# Patient Record
Sex: Female | Born: 1960
Health system: Southern US, Community
[De-identification: ages and names within clinical notes are randomized; demographics above are authoritative.]

## PROBLEM LIST (undated history)

## (undated) DIAGNOSIS — Z8719 Personal history of other diseases of the digestive system: Secondary | ICD-10-CM

## (undated) DIAGNOSIS — F329 Major depressive disorder, single episode, unspecified: Secondary | ICD-10-CM

## (undated) DIAGNOSIS — R112 Nausea with vomiting, unspecified: Secondary | ICD-10-CM

## (undated) DIAGNOSIS — F32A Depression, unspecified: Secondary | ICD-10-CM

## (undated) DIAGNOSIS — Z9889 Other specified postprocedural states: Secondary | ICD-10-CM

## (undated) DIAGNOSIS — E119 Type 2 diabetes mellitus without complications: Secondary | ICD-10-CM

## (undated) DIAGNOSIS — R519 Headache, unspecified: Secondary | ICD-10-CM

## (undated) DIAGNOSIS — M1990 Unspecified osteoarthritis, unspecified site: Secondary | ICD-10-CM

## (undated) DIAGNOSIS — I10 Essential (primary) hypertension: Secondary | ICD-10-CM

## (undated) DIAGNOSIS — R011 Cardiac murmur, unspecified: Secondary | ICD-10-CM

## (undated) DIAGNOSIS — C801 Malignant (primary) neoplasm, unspecified: Secondary | ICD-10-CM

## (undated) DIAGNOSIS — R197 Diarrhea, unspecified: Secondary | ICD-10-CM

## (undated) DIAGNOSIS — Z9221 Personal history of antineoplastic chemotherapy: Secondary | ICD-10-CM

## (undated) DIAGNOSIS — Z87442 Personal history of urinary calculi: Secondary | ICD-10-CM

## (undated) DIAGNOSIS — F419 Anxiety disorder, unspecified: Secondary | ICD-10-CM

## (undated) DIAGNOSIS — D649 Anemia, unspecified: Secondary | ICD-10-CM

## (undated) DIAGNOSIS — R51 Headache: Secondary | ICD-10-CM

## (undated) HISTORY — PX: COLONOSCOPY: SHX174

## (undated) HISTORY — PX: CYSTOSCOPY: SUR368

## (undated) HISTORY — PX: ABDOMINAL HYSTERECTOMY: SHX81

## (undated) HISTORY — PX: TONSILLECTOMY: SUR1361

## (undated) HISTORY — PX: MASTECTOMY: SHX3

## (undated) HISTORY — PX: ESOPHAGUS SURGERY: SHX626

## (undated) HISTORY — PX: KNEE ARTHROSCOPY: SUR90

## (undated) HISTORY — PX: BREAST SURGERY: SHX581

## (undated) HISTORY — PX: CHOLECYSTECTOMY: SHX55

---

## 2002-09-19 ENCOUNTER — Emergency Department (HOSPITAL_COMMUNITY): Admission: EM | Admit: 2002-09-19 | Discharge: 2002-09-19 | Payer: Self-pay

## 2003-08-24 ENCOUNTER — Observation Stay (HOSPITAL_COMMUNITY): Admission: EM | Admit: 2003-08-24 | Discharge: 2003-08-25 | Payer: Self-pay | Admitting: Emergency Medicine

## 2003-09-07 ENCOUNTER — Emergency Department (HOSPITAL_COMMUNITY): Admission: EM | Admit: 2003-09-07 | Discharge: 2003-09-07 | Payer: Self-pay | Admitting: Emergency Medicine

## 2009-03-26 ENCOUNTER — Encounter: Payer: Self-pay | Admitting: Gastroenterology

## 2009-03-29 ENCOUNTER — Encounter (INDEPENDENT_AMBULATORY_CARE_PROVIDER_SITE_OTHER): Payer: Self-pay | Admitting: *Deleted

## 2009-04-21 DIAGNOSIS — K219 Gastro-esophageal reflux disease without esophagitis: Secondary | ICD-10-CM

## 2009-04-28 ENCOUNTER — Ambulatory Visit: Payer: Self-pay | Admitting: Gastroenterology

## 2009-04-28 DIAGNOSIS — R079 Chest pain, unspecified: Secondary | ICD-10-CM

## 2009-05-06 ENCOUNTER — Ambulatory Visit: Payer: Self-pay | Admitting: Gastroenterology

## 2009-05-07 ENCOUNTER — Telehealth: Payer: Self-pay | Admitting: Gastroenterology

## 2009-05-07 ENCOUNTER — Encounter: Payer: Self-pay | Admitting: Gastroenterology

## 2009-05-11 ENCOUNTER — Encounter: Payer: Self-pay | Admitting: Gastroenterology

## 2009-06-28 ENCOUNTER — Ambulatory Visit: Payer: Self-pay | Admitting: Gastroenterology

## 2009-06-28 DIAGNOSIS — R1013 Epigastric pain: Secondary | ICD-10-CM

## 2009-06-28 DIAGNOSIS — K2270 Barrett's esophagus without dysplasia: Secondary | ICD-10-CM

## 2009-06-28 DIAGNOSIS — K222 Esophageal obstruction: Secondary | ICD-10-CM | POA: Insufficient documentation

## 2009-09-06 ENCOUNTER — Telehealth: Payer: Self-pay | Admitting: Gastroenterology

## 2009-09-09 ENCOUNTER — Ambulatory Visit (HOSPITAL_COMMUNITY): Admission: RE | Admit: 2009-09-09 | Discharge: 2009-09-09 | Payer: Self-pay | Admitting: Gastroenterology

## 2009-09-09 ENCOUNTER — Encounter: Payer: Self-pay | Admitting: Gastroenterology

## 2009-09-14 ENCOUNTER — Encounter (INDEPENDENT_AMBULATORY_CARE_PROVIDER_SITE_OTHER): Payer: Self-pay | Admitting: *Deleted

## 2009-09-14 ENCOUNTER — Ambulatory Visit: Payer: Self-pay | Admitting: Gastroenterology

## 2009-09-15 ENCOUNTER — Telehealth: Payer: Self-pay | Admitting: Gastroenterology

## 2009-10-12 ENCOUNTER — Telehealth: Payer: Self-pay | Admitting: Gastroenterology

## 2010-04-18 ENCOUNTER — Encounter: Payer: Self-pay | Admitting: Gastroenterology

## 2010-05-09 ENCOUNTER — Encounter: Payer: Self-pay | Admitting: Gastroenterology

## 2010-05-31 NOTE — Progress Notes (Signed)
Summary: Update  ---- Converted from flag ---- ---- 10/11/2009 3:18 PM, Louis Meckel MD wrote: Ask pt if she has had a screening colon.  If not would schedule ------------------------------  Phone Note Outgoing Call   Call placed by: Laureen Ochs LPN,  October 12, 2009 8:41 AM Call placed to: Patient Summary of Call: Pt. had a Colonoscopy when she was in her mid 40's, showed some diverticulosis. Pt. will have that report sent to [place].  Pt. continues with some  soreness in RUQ, states the Clinoril has decreased the pain.  When does she need to see you again? Initial call taken by: Laureen Ochs LPN,  October 12, 2009 8:44 AM  Follow-up for Phone Call        f/u as needed if she's responding to clinoril f/u colo 3-4 years Follow-up by: Louis Meckel MD,  October 12, 2009 9:37 AM  Additional Follow-up for Phone Call Additional follow up Details #1::        Recall is in [facility] for 05/2013. Pt. will keep her scheduled f/u OV on 10-27-09. Pt. instructed to call back as needed.  Additional Follow-up by: Laureen Ochs LPN,  October 12, 2009 9:57 AM

## 2010-05-31 NOTE — Letter (Signed)
Summary: Patient Notice-Barrett's [facility]  [address] [place], [place] [address]   Phone: [phone]  Fax: [phone]        May 11, 2009 MRN: [id number]    [facility] [address] [place], [place]  [address]    Dear Ms. Witucki,  I am pleased to inform you that the biopsies taken during your recent endoscopic examination did not show any evidence of cancer upon pathologic examination.  However, your biopsies indicate you have a condition known as Barrett's esophagus. While not cancer, it is pre-cancerous (can progress to cancer) and needs to be monitored with repeat endoscopic examination and biopsies.  Fortunately, it is quite rare that this develops into cancer, but careful monitoring of the condition along with taking your medication as prescribed is important in reducing the risk of developing cancer.  It is my recommendation that you have a repeat upper gastrointestinal endoscopic examination in _1 years.  Additional information/recommendations:  __Please call [phone] to schedule a return visit to further      evaluate your condition.  _x_Continue with treatment plan as outlined the day of your exam.  Please call us if you have or develop heartburn, reflux symptoms, any swallowing problems, or if you have questions about your condition that have not been fully answered at this time.  Sincerely,  Louis Meckel MD  This letter has been electronically signed by your physician.  Appended Document: Patient Notice-Barrett's Esopghagus Letter mailed 01.12.11

## 2010-05-31 NOTE — Progress Notes (Signed)
Summary: Triage  Phone Note Call from Patient Call back at [phone]//[phone]   Caller: Patient Call For: Dr. Arlyce Dice Reason for Call: Talk to Nurse Summary of Call: pt. has some questions about the meds that was prescribed yesterday Initial call taken by: Karna Christmas,  Sep 15, 2009 8:46 AM  Follow-up for Phone Call        Clarified for pt. the rx'.s. for Clinoril and Hyomax. Follow-up by: Teryl Lucy RN,  Sep 15, 2009 9:28 AM

## 2010-05-31 NOTE — Progress Notes (Signed)
Summary: TRIAGE-Pain in esophagus  Phone Note Call from Patient Call back at Work Phone [phone]   Call For: DR Arlyce Dice Reason for Call: Talk to Nurse Summary of Call: Pain in her esophagus.  Says she feels like there is a bubble in there. Initial call taken by: Leanor Kail [facility],  Sep 06, 2009 2:06 PM  Follow-up for Phone Call        Last Endo. 05-06-09, last OV 06-28-09.  Pt. c/o 1 week of pressure in her mid-chest and shoulders, states when she bends over it feels like there is something there, a sharp pain, and it takes her breath away. Relieves it by drinking warm water, soup or mylanta. Tried tylenol and Ibuprofen, helps somewhat. Takes Pantoprazole 40mg  QAM and Gas-x as needed.  DR.KAPLAN PLEASE ADVISE  Follow-up by: Laureen Ochs LPN,  Sep 07, 863 2:26 PM  Additional Follow-up for Phone Call Additional follow up Details #1::        antacids as needed cxr ultrasound ov increase protonix to bid Additional Follow-up by: Louis Meckel MD,  Sep 06, 2009 3:53 PM    Additional Follow-up for Phone Call Additional follow up Details #2::    Above MD orders reviewed with patient. Her CXR/Ultrasound is scheduled at [facility] on 09-09-09 at 8am, NPO after 12mn and her OV is 09-14-09 at 4pm. If symptoms become worse call back immediately or go to ER. Pt. instructed to call back as needed.  Follow-up by: Laureen Ochs LPN,  Sep 06, 7844 4:25 PM

## 2010-05-31 NOTE — Letter (Signed)
Summary: Out of Work  [facility]  [address] [place], [place] [address]   Phone: [phone]  Fax: [phone]    06/28/2009  TO: WHOM IT MAY CONCERN  RE: Katherine Acosta [address] [id number]       The above named individual is currently under my care and will be out of work    FROM: 06/28/2009   THROUGH:06/29/2009      MAY RETURN ON:     If you have any further questions or need additional information, please call.     Sincerely,    Aithana Kushner,MD  typed by: Merri Ray CMA (AAMA)

## 2010-05-31 NOTE — Letter (Signed)
Summary: Work [facility]  [address] [place], [place] [address]   Phone: [phone]  Fax: [phone]    Today's Date: Sep 14, 2009  Name of Patient: Katherine Acosta  The above named patient had a medical visit today at:  3:30 pm.  Please take this into consideration when reviewing the time away from work/school.    Special Instructions:  [  ] None  [X]  To be off the remainder of today, returning to the normal work / school schedule tomorrow.  [  ] To be off until the next scheduled appointment on ______________________.  [  ] Other ________________________________________________________________ ________________________________________________________________________   Sincerely yours,   Francee Piccolo CMA (AAMA)

## 2010-05-31 NOTE — Progress Notes (Signed)
Summary: Work Note  Phone Note Call from Patient Call back at [phone] x [phone]   Summary of Call: pt needs note for work for procedure yesterday.  Would like note faxed to [phone] Initial call taken by: Francee Piccolo CMA Duncan Dull),  May 07, 2009 8:42 AM  Follow-up for Phone Call        Pt. aware note has been faxed. Pt. co/ a sore throat. 1) Soft,bland foods for 1-2 days, advance as tolerated. 2) Warm salt water gargles or chloraseptic spray as needed 3) tylenol/Ibuprofen as needed 4) If symptoms become worse call back immediately. Follow-up by: Laureen Ochs LPN,  May 07, 2009 8:57 AM

## 2010-05-31 NOTE — Procedures (Signed)
Summary: Upper Endoscopy  Patient: Katherine Acosta Note: All result statuses are Final unless otherwise noted.  Tests: (1) Upper Endoscopy (EGD)   EGD Upper Endoscopy       DONE     [facility]     [address]     [place], [place]  [address]           ENDOSCOPY PROCEDURE REPORT           PATIENT:  Katherine Acosta, Katherine Acosta  MR#:  [id number]     BIRTHDATE:  January 28, 1961, 48 yrs. old  GENDER:  female           ENDOSCOPIST:  Barbette Hair. Arlyce Dice, MD     Referred by:           PROCEDURE DATE:  05/06/2009     PROCEDURE:  EGD with biopsy, Maloney Dilation of Esophagus     ASA CLASS:  Class II     INDICATIONS:  chest pain, dysphagia           MEDICATIONS:   Fentanyl 50 mcg IV, Versed 7 mg IV, glycopyrrolate     (Robinal) 0.2 mg IV, 0.6cc simethancone 0.6 cc PO     TOPICAL ANESTHETIC:  Exactacain Spray           DESCRIPTION OF PROCEDURE:   After the risks benefits and     alternatives of the procedure were thoroughly explained, informed     consent was obtained.  The LB [id number]-H180 [id number] endoscope was     introduced through the mouth and advanced to the third portion of     the duodenum, without limitations.  The instrument was slowly     withdrawn as the mucosa was fully examined.     <<PROCEDUREIMAGES>>           irregular Z-line at the gastroesophageal junction (see image1,     image2, and image3). Bxs taken to r/o Barrett's esophagus     stenosis. Mild stenosis Dilation with maloney dilator 18mm Moderate     resistance; no heme  Otherwise the examination was normal.     Retroflexed views revealed no abnormalities.    The scope was then     withdrawn from the patient and the procedure completed.           COMPLICATIONS:  None           ENDOSCOPIC IMPRESSION:     1) Irregular Z-line at the gastroesophageal junction - r/o     Barrett's esophagus     2) Stenosis at GE junction -s/p dilitation     3) Otherwise normal examination     RECOMMENDATIONS:     1) continue current medications    2) Call office next 2-3 days to schedule an office appointment     for 3-4 weeks     3) await biopsy results           REPEAT EXAM:  No           ______________________________     Barbette Hair. Arlyce Dice, MD           CC:  Aleatha Borer, MD           n.     Rosalie Doctor:   Barbette Hair. Christo Hain at 05/06/2009 04:40 PM           [place], [place], [phone]  Note: An exclamation mark (!) indicates a result that was not dispersed into the flowsheet. Document Creation Date: 05/06/2009 4:40  PM _______________________________________________________________________  (1) Order result status: Final Collection or observation date-time: 05/06/2009 16:32 Requested date-time:  Receipt date-time:  Reported date-time:  Referring Physician:   Ordering Physician: Melvia Heaps [id number]) Specimen Source:  Source: Launa Grill Order Number: [id number] Lab site:   Appended Document: Upper Endoscopy     Procedures Next Due Date:    EGD: 05/2010

## 2010-05-31 NOTE — Assessment & Plan Note (Signed)
Summary: FOLLOW-UP FRROM TRIAGE 09-06-09, CXR AND ULTRASOUND            ...   History of Present Illness Visit Type: Follow-up Visit Primary GI MD: Melvia Heaps MD [facility] Primary Provider: Kirt Boys, MD  Requesting Provider: n/a Chief Complaint: follow up to review recent radiology studies, pt states she continues to have epigastric pain and a tightness in her chest History of Present Illness:   Katherine Acosta has returned for followup of her upper abdominal pain.  Abdominal ultrasound and chest x-ray were negative for source of abdominal and chest pain.  She reports that pain can be induced with bending over and lifting.  She occasionally may have discomfort when lying supine.  Pain can also occur postprandially.  She remains on pantoprazole   GI Review of Systems    Reports abdominal pain.     Location of  Abdominal pain: epigastric area.    Denies acid reflux, belching, bloating, chest pain, dysphagia with liquids, dysphagia with solids, heartburn, loss of appetite, nausea, vomiting, vomiting blood, weight loss, and  weight gain.        Denies anal fissure, black tarry stools, change in bowel habit, constipation, diarrhea, diverticulosis, fecal incontinence, heme positive stool, hemorrhoids, irritable bowel syndrome, jaundice, light color stool, liver problems, rectal bleeding, and  rectal pain.    Current Medications (verified): 1)  Benazepril Hcl 20 Mg Tabs (Benazepril Hcl) .... Once Daily 2)  Pantoprazole Sodium 40 Mg Tbec (Pantoprazole Sodium) .... Once Daily 3)  Venlafaxine Hcl 75 Mg Tabs (Venlafaxine Hcl) .... Once Daily 4)  Alprazolam 1 Mg Tabs (Alprazolam) .... Take 1/2 Tablet As Needed 5)  Aspir-Trin 325 Mg Tbec (Aspirin) .... Once Daily 6)  Gas-X Extra Strength 125 Mg Chew (Simethicone) .... As Needed 7)  Ex-Lax 15 Mg Tabs (Sennosides) .... Take 2 Tablets By Mouth As Needed  Allergies (verified): No Known Drug Allergies  Past History:  Past Medical History: Reviewed  history from 06/28/2009 and no changes required. Current Problems:  GERD (ICD-530.81) Anxiety Disorder Depression Diabetes---Diet Control  Hypertension Barretts Esophagus  Past Surgical History: Reviewed history from 04/28/2009 and no changes required. Hernia Surgery Hysterectomy Cholecystectomy C-Section x 2   Family History: Reviewed history from 04/28/2009 and no changes required. No FH of Colon Cancer: Family History of Prostate Cancer:Father Family History of Diabetes: Multiple Family Members  Family History of Heart Disease: Multiple Family Members on father side   Social History: Reviewed history from 04/28/2009 and no changes required. 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  Alcohol Use - no Illicit Drug Use - no  Review of Systems  The patient denies allergy/sinus, anemia, anxiety-new, arthritis/joint pain, back pain, blood in urine, breast changes/lumps, confusion, cough, coughing up blood, depression-new, fainting, fatigue, fever, headaches-new, hearing problems, heart murmur, heart rhythm changes, itching, menstrual pain, muscle pains/cramps, night sweats, nosebleeds, pregnancy symptoms, shortness of breath, skin rash, sleeping problems, sore throat, swelling of feet/legs, swollen lymph glands, thirst - excessive, urination - excessive, urination changes/pain, urine leakage, vision changes, and voice change.    Vital Signs:  Patient profile:   50 year old female Height:      62 inches Weight:      194 pounds BMI:     35.61 Pulse rate:   68 / minute Pulse rhythm:   regular BP sitting:   100 / 70  (left arm) Cuff size:   regular  Vitals Entered By: Katherine Acosta) (Sep 14, 2009 3:35  PM)  Physical Exam  Additional Exam:  On abdominal exam there is mild tenderness to palpation in the upper midepigastrium.  Tenderness slightly increases with abdominal muscle wall flexion   Impression &  Recommendations:  Problem # 1:  ABDOMINAL PAIN, EPIGASTRIC (ICD-789.06) Pain appears to be more musculoskeletal in origin than visceral pain.  Distention of the stomach may apply pressure to her abdominal wall which may explain occasional exacerbation by heavy eating.  Recommendations #1 trial of clinoril 200 mg b.i.d. for one week then p.r.n. #2 hyomax p.r.n.  Patient Instructions: 1)  Copy sent to : [facility] Carter,MD 2)  Make a follow up appointment for 4 weeks 3)  The medication list was reviewed and reconciled.  All changed / newly prescribed medications were explained.  A complete medication list was provided to the patient / caregiver. Prescriptions: HYOMAX-SL 0.125 MG SUBL (HYOSCYAMINE SULFATE) take 2 tablets sublingual q.4 h. p.r.n. abdominal pain  #20 x 1   Entered and Authorized by:   Louis Meckel MD   Signed by:   Louis Meckel MD on 09/14/2009   Method used:   Electronically to        [address] [phone]* (retail)       [address]       [place], [place]  [address]       Ph: [phone]       Fax: [phone]   RxID:   [id number] CLINORIL 200 MG TABS (SULINDAC) date 1tab twice a day for 7 days then as needed  #30 x 1   Entered and Authorized by:   Louis Meckel MD   Signed by:   Louis Meckel MD on 09/14/2009   Method used:   Electronically to        [address] [phone]* (retail)       [address]       [place], [place]  [address]       Ph: [phone]       Fax: [phone]   RxID:   [id number]

## 2010-05-31 NOTE — Letter (Signed)
Summary: Out of Work  [facility]  [address] [place], [place] [address]   Phone: [phone]  Fax: [phone]    May 07, 2009   Employee:  BILLIE TRAGER    To Whom It May Concern:   For Medical reasons, please excuse the above named employee from work for the following dates:    05-06-2009.    If you need additional information, please feel free to contact our office.         Sincerely,       Barbette Hair.Cassandr Cederberg,MD    Laureen Ochs LPN  Appended Document: Out of Work Faxed to pt. work, per her request.

## 2010-05-31 NOTE — Letter (Signed)
Summary: Results Letter  [facility]  [address] [place], [place] [address]   Phone: [phone]  Fax: [phone]        Sep 09, 2009 MRN: [id number]    [facility] [address] [place], [place]  [address]    Dear Ms. Lurie,  Your ultrasound  results did not show any remarkable findings.  Please continue with the recommendations previously discussed.  Should you have any further questions or immediate concers, feel free to contact me.  Sincerely,  Barbette Hair. Arlyce Dice, M.D., [facility]          Sincerely,  Louis Meckel MD  This letter has been electronically signed by your physician.  Appended Document: Results Letter letter mailed

## 2010-05-31 NOTE — Assessment & Plan Note (Signed)
Summary: F/U FROM ENDO.               DEBORAH   History of Present Illness Visit Type: Follow-up Visit Primary GI MD: Melvia Heaps MD [facility] Primary Provider: Kirt Boys, MD  Requesting Provider: n/a Chief Complaint: follow up EGD-Barrett's, pt states she is still having bloating, she is using Gas-X.  Pt feels like she has a gas bubble in chest that needs to come out.  Pt also states she is having problems with constipation.  pt states she is haivng a cramp in abdomen that is not relieved by passing gas or having BM History of Present Illness:   Ms. Mcdermid has returned for evaluation of abdominal discomfort.  She has had spontaneous crampy upper abdominal pain that is induced by twisting turning or pending.  Pain may last minutes at a time and will dissipate if she massages her abdomen..  It is unrelated to eating.  Endoscopy in January, 2011 demonstrated a distal esophageal stricture which was dilated and Barrett's esophagus.   GI Review of Systems    Reports abdominal pain and  bloating.     Location of  Abdominal pain: generalized.    Denies acid reflux, belching, chest pain, dysphagia with liquids, dysphagia with solids, heartburn, loss of appetite, nausea, vomiting, vomiting blood, weight loss, and  weight gain.      Reports constipation.     Denies anal fissure, black tarry stools, change in bowel habit, diarrhea, diverticulosis, fecal incontinence, heme positive stool, hemorrhoids, irritable bowel syndrome, jaundice, light color stool, liver problems, rectal bleeding, and  rectal pain.    Current Medications (verified): 1)  Benazepril Hcl 20 Mg Tabs (Benazepril Hcl) .... Once Daily 2)  Pantoprazole Sodium 40 Mg Tbec (Pantoprazole Sodium) .... Once Daily 3)  Venlafaxine Hcl 75 Mg Tabs (Venlafaxine Hcl) .... Once Daily 4)  Alprazolam 1 Mg Tabs (Alprazolam) .... Take 1/2 Tablet As Needed 5)  Aspir-Trin 325 Mg Tbec (Aspirin) .... Once Daily 6)  Gas-X Extra Strength 125 Mg Chew  (Simethicone) .... As Needed 7)  Ex-Lax 15 Mg Tabs (Sennosides) .... Take 2 Tablets By Mouth As Needed  Allergies (verified): No Known Drug Allergies  Past History:  Past Medical History: Current Problems:  GERD (ICD-530.81) Anxiety Disorder Depression Diabetes---Diet Control  Hypertension Barretts Esophagus  Past Surgical History: Reviewed history from 04/28/2009 and no changes required. Hernia Surgery Hysterectomy Cholecystectomy C-Section x 2   Family History: Reviewed history from 04/28/2009 and no changes required. No FH of Colon Cancer: Family History of Prostate Cancer:Father Family History of Diabetes: Multiple Family Members  Family History of Heart Disease: Multiple Family Members on father side   Social History: Reviewed history from 04/28/2009 and no changes required. 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  Alcohol Use - no Illicit Drug Use - no  Vital Signs:  Patient profile:   50 year old female Height:      62 inches Weight:      195 pounds BMI:     35.79 Pulse rate:   92 / minute Pulse rhythm:   regular BP sitting:   90 / 66  (left arm) Cuff size:   regular  Vitals Entered By: Francee Piccolo CMA Duncan Dull) (June 28, 2009 2:41 PM)  Physical Exam  Additional Exam:  On physical exam she is an obese female in no acute distress  Abdomen is without masses, tenderness organomegaly: There is very mild tenderness to palpation in her mid epigastrium with abdominal  muscle wall flexion   Impression & Recommendations:  Problem # 1:  ABDOMINAL PAIN, EPIGASTRIC (ICD-789.06) I suspect that her abdominalpain is related to abdominal wall pain secondary to muscle spasm.  Recommendations #1 supplementation with quinine if spasms occur with any regularity. #2 no GI workup at this time  Problem # 2:  BARRETTS ESOPHAGUS (ICD-530.85) Plan followup endoscopy in October, 2011  Problem # 3:  STRICTURE AND STENOSIS OF  ESOPHAGUS (ICD-530.3) Plan repeat dilatation p.r.n.  Patient Instructions: 1)  CC Dr. Kirt Boys

## 2010-06-02 NOTE — Letter (Signed)
Summary: Endoscopy Letter  [facility]  [address] [place], [place] [address]   Phone: [phone]  Fax: [phone]      May 09, 2010 MRN: [id number]   [facility] [address] [place], [place]  [address]   Dear Ms. Alleva,   According to your medical record, it is time for you to schedule an Endoscopy. Endoscopic screening is recommended for patients with certain upper digestive tract conditions because of associated increased risk for cancers of the upper digestive system.  This letter has been generated based on the recommendations made at the time of your prior procedure. If you feel that in your particular situation this may no longer apply, please contact our office.  Please call our office at [phone]) to schedule this appointment or to update your records at your earliest convenience.  Thank you for cooperating with [place] to provide you with the very best care possible.   Sincerely,  Barbette Hair. Arlyce Dice, M.D.  [facility] [phone]

## 2010-06-02 NOTE — Procedures (Signed)
Summary: Recall / [facility]  Recall / [facility]   Imported By: Lennie Odor 05/13/2010 16:17:07  _____________________________________________________________________  External Attachment:    Type:   Image     Comment:   External Document

## 2010-09-16 NOTE — Cardiovascular Report (Signed)
NAMEPIERRE, CUMPTON                             ACCOUNT NO.:  [id number]   MEDICAL RECORD NO.:  [id number]                   PATIENT TYPE:  INP   LOCATION:  1829                                 FACILITY:  [facility]   PHYSICIAN:  Nanetta Batty, M.D.                DATE OF BIRTH:  November 18, 1960   DATE OF PROCEDURE:  08/25/2003  DATE OF DISCHARGE:                              CARDIAC CATHETERIZATION   INDICATIONS:  Katherine Acosta is a 50 year old married female admitted by Dr.  Lenise Herald on April 26 with unstable angina.  She has minimal cardiac  risk factors, but the pain was classic.  She had no acute ST segment  changes and markers were negative.  She was placed on IV heparin and nitro  and presents now for diagnostic coronary arteriography.   PROCEDURE DESCRIPTION:  The patient was brought to the second floor [facility] in the postabsorptive state.  She was  premedicated with p.o. Valium and IV Versed.  Her right groin was prepped  and shaved in the usual sterile fashion.  1% Xylocaine was used for local  anesthesia.  A 6 French sheath was inserted into the right femoral artery  using standard Seldinger technique. A 6 French right and left Judkins  diagnostic catheter as well as 6 French pigtail catheter were used for  selective coronary angiography, left ventriculography, supravalvular  aortography.  Omnipaque dye was used for the entirety of the case.  Retrograde aortic, ventricular pullback pressures were recorded.   HEMODYNAMICS:  1. Aortic systolic pressure 119, diastolic pressure 78.  2. Left ventricular systolic pressure 119, end-diastolic pressure 17.   SELECTIVE CORONARY ANGIOGRAPHY:  1. Left main normal.  2. LAD:  Normal.  3. Left circumflex:  Normal.  4. Right coronary artery:  Dominant and normal.   LEFT VENTRICULOGRAPHY:  RAO left ventriculogram was performed using 20 mL of  Omnipaque dye at 10 mL per second.  The overall LVEF is estimated  approximately 55% without focal wall motion abnormalities.   SUPRAVALVULAR AORTOGRAPHY:  Performed in the LAO view using 20 mL of  Omnipaque dye at 20 mL per second.  The arch revealed no dissection.  There  is no AI.  The arch vessels were intact.   IMPRESSION:  Katherine Acosta has essentially normal coronary arteries and normal  LV function.  There is no evidence of aortic dissection.  I believe her  chest pain is noncardiac most likely gastrointestinal.  Empiric anti-reflux  measures will be recommended.   An ACT was measured and the sheath was removed.  Pressure was held to the  groin to achieve hemostasis.  The patient left the lab in stable condition.  She will be discharged home later today and will be followed with her  primary care physician.  Nanetta Batty, M.D.    Cordelia Pen  D:  08/25/2003  T:  08/25/2003  Job:  [id number]   cc:   [facility]  [address]  [place], [place] [address]

## 2012-01-15 ENCOUNTER — Encounter: Payer: Self-pay | Admitting: Gastroenterology

## 2013-03-17 ENCOUNTER — Encounter: Payer: Self-pay | Admitting: Gastroenterology

## 2013-07-30 HISTORY — PX: MASTECTOMY: SHX3

## 2013-10-02 ENCOUNTER — Encounter: Payer: Self-pay | Admitting: Gastroenterology

## 2014-03-31 ENCOUNTER — Other Ambulatory Visit: Payer: Self-pay | Admitting: Plastic Surgery

## 2014-05-13 ENCOUNTER — Telehealth: Payer: Self-pay | Admitting: *Deleted

## 2014-05-13 NOTE — Telephone Encounter (Signed)
Pt returned my call and I confirmed 05/18/14 appt w/ her.  Mailed before appt letter, calendar, welcoming packet & intake form to pt.  Emailed Engineer, civil (consulting) at [employer] to make her aware.  Called & left a message for [facility] for records.

## 2014-05-13 NOTE — Telephone Encounter (Signed)
Received referral from [facility].  Called pt & left a message for her to return my call so I can schedule her w/ a med onc.

## 2014-05-18 ENCOUNTER — Ambulatory Visit (HOSPITAL_BASED_OUTPATIENT_CLINIC_OR_DEPARTMENT_OTHER): Payer: BLUE CROSS/BLUE SHIELD | Admitting: Hematology and Oncology

## 2014-05-18 ENCOUNTER — Encounter: Payer: Self-pay | Admitting: Hematology and Oncology

## 2014-05-18 ENCOUNTER — Ambulatory Visit: Payer: BLUE CROSS/BLUE SHIELD

## 2014-05-18 ENCOUNTER — Encounter (INDEPENDENT_AMBULATORY_CARE_PROVIDER_SITE_OTHER): Payer: Self-pay

## 2014-05-18 ENCOUNTER — Telehealth: Payer: Self-pay | Admitting: Hematology and Oncology

## 2014-05-18 ENCOUNTER — Other Ambulatory Visit: Payer: Self-pay | Admitting: *Deleted

## 2014-05-18 ENCOUNTER — Encounter: Payer: Self-pay | Admitting: *Deleted

## 2014-05-18 VITALS — BP 140/86 | HR 88 | Temp 98.2°F | Resp 20 | Ht 62.0 in | Wt 183.8 lb

## 2014-05-18 DIAGNOSIS — C50512 Malignant neoplasm of lower-outer quadrant of left female breast: Secondary | ICD-10-CM

## 2014-05-18 DIAGNOSIS — N6091 Unspecified benign mammary dysplasia of right breast: Secondary | ICD-10-CM

## 2014-05-18 DIAGNOSIS — Z803 Family history of malignant neoplasm of breast: Secondary | ICD-10-CM

## 2014-05-18 DIAGNOSIS — E119 Type 2 diabetes mellitus without complications: Secondary | ICD-10-CM

## 2014-05-18 DIAGNOSIS — M8580 Other specified disorders of bone density and structure, unspecified site: Secondary | ICD-10-CM

## 2014-05-18 DIAGNOSIS — I10 Essential (primary) hypertension: Secondary | ICD-10-CM

## 2014-05-18 NOTE — Assessment & Plan Note (Addendum)
Left breast invasive ductal carcinoma multifocal disease, 2 cm, 2 cm, 4 mm, T1 cN0 M0 stage IA 3 SLN negative but it showed IHC positive for micrometastatic disease less than 200 cells accompanied by extensive DCIS grade 2 margins negative, no lymphovascular invasion or dermal invasion, Oncotype DX recurrence score 15 (9% ROR) and 6 (5% ROR) on each of the tumors with third tumor could not be done.  Counseling: I agree with her medical oncologist (Dr.Vallathucherry, Cruzita Lederer) note regarding treatment. She was started on antiestrogen therapy with letrozole in May 2015. There is no indication for therapy or radiation.  Right breast atypical lobular hyperplasia: This was detected during the reduction mammoplasty surgery done by Dr. Harlow Mares. I do not believe she will need additional surgery since [facility] is a risk factor for breast cancer and can be watched and monitored with the imaging studies. Since she did not get a breast MRI, I would like to see the remainder of the breast using a mammogram and a breast MRI in March to further evaluate her breasts.  Osteopenia: Currently on Prolia injections every 6 months. She would like [place] to reinitiate her treatment which was supposed to be done in December. We will set her up for Wednesday when she comes back to see Dr. Harlow Mares. She takes calcium and vitamin D.  Return to clinic in March after the mammogram and MRI to discuss the results. Monitoring closely for toxicities related to Arimidex.

## 2014-05-18 NOTE — Progress Notes (Signed)
Checked in new pt with no financial concerns prior to seeing the dr. I informed pt that if chemo is part of her treatment Raquel will contact her ins to see if Josem Kaufmann is req and will obtain that if it is as well as contact foundations that offer copay assistance for chemo if needed. She has Raquel's card for any billing or insurance questions or concerns.

## 2014-05-18 NOTE — Progress Notes (Signed)
Katherine Acosta CONSULT NOTE  Patient Care Team: Patric Dykes, MD as PCP - General (Family Medicine)  CHIEF COMPLAINTS/PURPOSE OF CONSULTATION:  Left breast invasive ductal carcinoma T1 cN0 I+ M0 stage IA ER 100%, PR 100%, HER-2 negative Right breast atypical lobular hyperplasia  HISTORY OF PRESENTING ILLNESS:  Katherine Acosta 54 y.o. female has a diagnosis of left breast invasive ductal carcinoma diagnosed in April 2015 she had multifocal disease and hence she underwent left mastectomy that revealed 3 tumors 2 cm, 2 cm and 4 mm in sizes. One of the lymph node in the axilla revealed immunohistochemical stain positive for cancer less than 200 cells. She was diagnosed with a stage IA breast cancer. Oncotype DX was sent for all 3 of these nodules but only 2 of them had test results came back as low risk disease. The third nodule could not be tested because of insufficient tissue. Because she has noticed she did not need systemic chemotherapy. She underwent immediate reconstruction of the breast which led to multiple complications but she recovered from all of that and she started seeing Dr. Harlow Mares because her plastic surgeon went on leave. She underwent in December reduction mammoplasty in the right breast which showed atypical lobular hyperplasia. She was seen back by her medical oncologist who referred her to general surgeon apparently there was a discussion that she might need right-sided mastectomy and she wanted to get a second opinion. She had met with Dr. Donne Hazel who also referred her to me to discuss the treatment plan.   I reviewed her records extensively and collaborated the history with the patient.  SUMMARY OF ONCOLOGIC HISTORY:   Breast cancer of lower-outer quadrant of left female breast   07/16/2013 Procedure No BRCA1 or 2 PA LB2 mutation   08/14/2013 Surgery Left breast mastectomy: Multifocal invasive ductal carcinoma 2 cm, 2 cm, 4 mm, focal DCIS, focal LCIS, margins negative,  1 node had micrometastatic disease less than 200 cells, Oncotype DX score is 15 and 6, well-differentiated, followed by reconstruction   09/15/2013 -  Anti-estrogen oral therapy Letrozole 2.5 mg by mouth daily   03/31/2014 Surgery Reduction mammoplasty on the right breast revealed atypical lobular hyperplasia   Family history: Prostate cancer in the father heart disease in mother and father along with hypertension she has 2 cousins with breast cancer one of whom was less than age 27 and 2 aunts with breast cancer Allergies no known drug allergies Social history: Occasional alcohol use, never smoker, drinks carbonated beverages and T denies any recreational drug use Gynecological history: Age of menarche 77, age of menopause 47-50, gravida 2 para 2 ages 66 and 87 Past medical history: Anxiety disorder, diabetes, GERD, hypertension  MEDICATIONS:  Current Outpatient Prescriptions  Medication Sig Dispense Refill  . ALPRAZolam (XANAX) 1 MG tablet Take 1 mg by mouth.    Marland Kitchen aspirin 81 MG tablet Take 81 mg by mouth.    . benazepril (LOTENSIN) 20 MG tablet TAKE 1 TABLET BY MOUTH EVERY DAY    . cyclobenzaprine (FLEXERIL) 10 MG tablet Take 10 mg by mouth.    . letrozole (FEMARA) 2.5 MG tablet Take 2.5 mg by mouth daily.    . metFORMIN (GLUCOPHAGE) 500 MG tablet Take 500 mg by mouth daily with breakfast.    . pantoprazole (PROTONIX) 40 MG tablet TAKE 1 TABLET BY MOUTH DAILY    . venlafaxine (EFFEXOR) 75 MG tablet Take 75 mg by mouth.     No current facility-administered medications for this  visit.    REVIEW OF SYSTEMS:   Constitutional: Denies fevers, chills or abnormal night sweats Eyes: Denies blurriness of vision, double vision or watery eyes Ears, nose, mouth, throat, and face: Denies mucositis or sore throat Respiratory: Denies cough, dyspnea or wheezes Cardiovascular: Denies palpitation, chest discomfort or lower extremity swelling Gastrointestinal:  Denies nausea, heartburn or change in bowel  habits Skin: Denies abnormal skin rashes Lymphatics: Denies new lymphadenopathy or easy bruising Neurological:Denies numbness, tingling or new weaknesses Behavioral/Psych: Mood is stable, no new changes  Breast:  Denies any palpable lumps or discharge All other systems were reviewed with the patient and are negative.  PHYSICAL EXAMINATION: ECOG PERFORMANCE STATUS: 1 - Symptomatic but completely ambulatory  Filed Vitals:   05/18/14 1255  BP: 140/86  Pulse: 88  Temp: 98.2 F (36.8 C)  Resp: 20   Filed Weights   05/18/14 1255  Weight: 183 lb 12.8 oz (83.371 kg)    GENERAL:alert, no distress and comfortable SKIN: skin color, texture, turgor are normal, no rashes or significant lesions EYES: normal, conjunctiva are pink and non-injected, sclera clear OROPHARYNX:no exudate, no erythema and lips, buccal mucosa, and tongue normal  NECK: supple, thyroid normal size, non-tender, without nodularity LYMPH:  no palpable lymphadenopathy in the cervical, axillary or inguinal LUNGS: clear to auscultation and percussion with normal breathing effort HEART: regular rate & rhythm and no murmurs and no lower extremity edema ABDOMEN:abdomen soft, non-tender and normal bowel sounds Musculoskeletal:no cyanosis of digits and no clubbing  PSYCH: alert & oriented x 3 with fluent speech NEURO: no focal motor/sensory deficits  ASSESSMENT AND PLAN:  Breast cancer of lower-outer quadrant of left female breast Left breast invasive ductal carcinoma multifocal disease, 2 cm, 2 cm, 4 mm, T1 cN0 M0 stage IA 3 SLN negative but it showed IHC positive for micrometastatic disease less than 200 cells accompanied by extensive DCIS grade 2 margins negative, no lymphovascular invasion or dermal invasion, Oncotype DX recurrence score 15 (9% ROR) and 6 (5% ROR) on each of the tumors with third tumor could not be done.  Counseling: I agree with her medical oncologist (Dr.Vallathucherry, Cruzita Lederer) note regarding treatment.  She was started on antiestrogen therapy with letrozole in May 2015. There is no indication for therapy or radiation.  Right breast atypical lobular hyperplasia: This was detected during the reduction mammoplasty surgery done by Dr. Harlow Mares. I do not believe she will need additional surgery since [facility] is a risk factor for breast cancer and can be watched and monitored with the imaging studies. Since she did not get a breast MRI, I would like to see the remainder of the breast using a mammogram and a breast MRI in March to further evaluate her breasts.  Osteopenia: Currently on Prolia injections every 6 months. She would like [place] to reinitiate her treatment which was supposed to be done in December. We will set her up for Wednesday when she comes back to see Dr. Harlow Mares. She takes calcium and vitamin D.  Return to clinic in March after the mammogram and MRI to discuss the results. Monitoring closely for toxicities related to Arimidex.     Rulon Eisenmenger, MD  3:24 PM

## 2014-05-18 NOTE — Telephone Encounter (Signed)
, °

## 2014-05-18 NOTE — Progress Notes (Signed)
Met with pt today prior to visit with Dr. Lindi Adie. Discussed navigation resources. Gave pt Alight bag and discussed resources in "Journey Book". Pt denies further needs at this time. Encourage pt to call with questions. Contact information given

## 2014-05-20 ENCOUNTER — Encounter (HOSPITAL_COMMUNITY): Payer: Self-pay | Admitting: *Deleted

## 2014-05-20 ENCOUNTER — Ambulatory Visit: Payer: BLUE CROSS/BLUE SHIELD

## 2014-05-20 ENCOUNTER — Telehealth: Payer: Self-pay

## 2014-05-20 ENCOUNTER — Other Ambulatory Visit (HOSPITAL_COMMUNITY): Payer: Self-pay | Admitting: Plastic Surgery

## 2014-05-20 MED ORDER — VANCOMYCIN HCL IN DEXTROSE 1-5 GM/200ML-% IV SOLN
1000.0000 mg | INTRAVENOUS | Status: AC
  Administered 2014-05-21: 1000 mg via INTRAVENOUS

## 2014-05-20 MED ORDER — HEPARIN SODIUM (PORCINE) 5000 UNIT/ML IJ SOLN
5000.0000 [IU] | Freq: Once | INTRAMUSCULAR | Status: AC
  Administered 2014-05-21: 5000 [IU] via SUBCUTANEOUS

## 2014-05-20 NOTE — Telephone Encounter (Signed)
Pt called re: injectino appt this am at 945.  Pt was at [facility] for an appt.  Rescheduled to 2 pm today.  Verifying ins approval - inbskt to Raquel.

## 2014-05-20 NOTE — Progress Notes (Signed)
Pt's PCP is Dr. Patric Dykes at [facility] in [place]. Called the office for an EKG, they do not have one. Pt instructed to call OR desk at 8 AM tomorrow to get time of surgery and instructed pt to be here 2 and 1/2 hours prior to surgery start time. She voiced understanding.

## 2014-05-21 ENCOUNTER — Other Ambulatory Visit: Payer: Self-pay

## 2014-05-21 ENCOUNTER — Inpatient Hospital Stay (HOSPITAL_COMMUNITY): Payer: BLUE CROSS/BLUE SHIELD | Admitting: Certified Registered"

## 2014-05-21 ENCOUNTER — Inpatient Hospital Stay (HOSPITAL_COMMUNITY)
Admission: RE | Admit: 2014-05-21 | Discharge: 2014-05-23 | DRG: 909 | Disposition: A | Discharge status: Home / Self Care | Payer: BLUE CROSS/BLUE SHIELD | Source: Ambulatory Visit | Attending: Plastic Surgery | Admitting: Plastic Surgery

## 2014-05-21 ENCOUNTER — Encounter (HOSPITAL_COMMUNITY): Payer: Self-pay | Admitting: *Deleted

## 2014-05-21 ENCOUNTER — Encounter (HOSPITAL_COMMUNITY)
Admission: RE | Disposition: A | Discharge status: Home / Self Care | Payer: Self-pay | Source: Ambulatory Visit | Attending: Plastic Surgery

## 2014-05-21 DIAGNOSIS — R110 Nausea: Secondary | ICD-10-CM | POA: Diagnosis not present

## 2014-05-21 DIAGNOSIS — Z9012 Acquired absence of left breast and nipple: Secondary | ICD-10-CM | POA: Diagnosis present

## 2014-05-21 DIAGNOSIS — T8131XA Disruption of external operation (surgical) wound, not elsewhere classified, initial encounter: Principal | ICD-10-CM | POA: Diagnosis present

## 2014-05-21 DIAGNOSIS — Y834 Other reconstructive surgery as the cause of abnormal reaction of the patient, or of later complication, without mention of misadventure at the time of the procedure: Secondary | ICD-10-CM | POA: Diagnosis present

## 2014-05-21 DIAGNOSIS — C50912 Malignant neoplasm of unspecified site of left female breast: Secondary | ICD-10-CM | POA: Diagnosis present

## 2014-05-21 HISTORY — PX: LATISSIMUS FLAP TO BREAST: SHX5357

## 2014-05-21 HISTORY — DX: Anxiety disorder, unspecified: F41.9

## 2014-05-21 HISTORY — DX: Malignant (primary) neoplasm, unspecified: C80.1

## 2014-05-21 HISTORY — DX: Headache: R51

## 2014-05-21 HISTORY — DX: Other specified postprocedural states: Z98.890

## 2014-05-21 HISTORY — DX: Diarrhea, unspecified: R19.7

## 2014-05-21 HISTORY — DX: Other specified postprocedural states: R11.2

## 2014-05-21 HISTORY — DX: Headache, unspecified: R51.9

## 2014-05-21 HISTORY — PX: BREAST RECONSTRUCTION: SHX9

## 2014-05-21 HISTORY — DX: Essential (primary) hypertension: I10

## 2014-05-21 HISTORY — DX: Type 2 diabetes mellitus without complications: E11.9

## 2014-05-21 HISTORY — DX: Personal history of other diseases of the digestive system: Z87.19

## 2014-05-21 HISTORY — DX: Depression, unspecified: F32.A

## 2014-05-21 HISTORY — DX: Anemia, unspecified: D64.9

## 2014-05-21 HISTORY — DX: Major depressive disorder, single episode, unspecified: F32.9

## 2014-05-21 LAB — CBC
HCT: 36.6 % (ref 36.0–46.0)
Hemoglobin: 13 g/dL (ref 12.0–15.0)
MCH: 31.2 pg (ref 26.0–34.0)
MCHC: 35.5 g/dL (ref 30.0–36.0)
MCV: 87.8 fL (ref 78.0–100.0)
Platelets: 236 10*3/uL (ref 150–400)
RBC: 4.17 MIL/uL (ref 3.87–5.11)
RDW: 12.8 % (ref 11.5–15.5)
WBC: 5.4 10*3/uL (ref 4.0–10.5)

## 2014-05-21 LAB — BASIC METABOLIC PANEL
Anion gap: 12 (ref 5–15)
BUN: 6 mg/dL (ref 6–23)
CO2: 23 mmol/L (ref 19–32)
CREATININE: 0.63 mg/dL (ref 0.50–1.10)
Calcium: 9.4 mg/dL (ref 8.4–10.5)
Chloride: 105 mEq/L (ref 96–112)
GFR calc non Af Amer: >90 mL/min (ref >90)
Glucose, Bld: 118 mg/dL — ABNORMAL HIGH (ref 70–99)
POTASSIUM: 3.8 mmol/L (ref 3.5–5.1)
SODIUM: 140 mmol/L (ref 135–145)

## 2014-05-21 LAB — GLUCOSE, CAPILLARY
GLUCOSE-CAPILLARY: 112 mg/dL — AB (ref 70–99)
Glucose-Capillary: 123 mg/dL — ABNORMAL HIGH (ref 70–99)
Glucose-Capillary: 165 mg/dL — ABNORMAL HIGH (ref 70–99)

## 2014-05-21 SURGERY — RECONSTRUCTION, BREAST, USING LATISSIMUS DORSI MYOCUTANEOUS FLAP
Anesthesia: General | Site: Breast | Laterality: Left

## 2014-05-21 MED ORDER — PROPOFOL 10 MG/ML IV BOLUS
INTRAVENOUS | Status: AC
  Filled 2014-05-21: qty 20

## 2014-05-21 MED ORDER — PROMETHAZINE HCL 25 MG/ML IJ SOLN
6.2500 mg | INTRAMUSCULAR | Status: DC | PRN
  Administered 2014-05-23 (×2): 6.25 mg via INTRAVENOUS
  Filled 2014-05-21 (×2): qty 1

## 2014-05-21 MED ORDER — GLYCOPYRROLATE 0.2 MG/ML IJ SOLN
INTRAMUSCULAR | Status: DC | PRN
  Administered 2014-05-21: 0.6 mg via INTRAVENOUS

## 2014-05-21 MED ORDER — BENAZEPRIL HCL 20 MG PO TABS
20.0000 mg | ORAL_TABLET | Freq: Every day | ORAL | Status: DC
  Administered 2014-05-21 – 2014-05-23 (×3): 20 mg via ORAL
  Filled 2014-05-21 (×3): qty 1

## 2014-05-21 MED ORDER — ROCURONIUM BROMIDE 50 MG/5ML IV SOLN
INTRAVENOUS | Status: AC
  Filled 2014-05-21: qty 2

## 2014-05-21 MED ORDER — FENTANYL CITRATE 0.05 MG/ML IJ SOLN
INTRAMUSCULAR | Status: AC
  Filled 2014-05-21: qty 5

## 2014-05-21 MED ORDER — PROPOFOL 10 MG/ML IV BOLUS
INTRAVENOUS | Status: DC | PRN
  Administered 2014-05-21: 150 mg via INTRAVENOUS

## 2014-05-21 MED ORDER — FENTANYL CITRATE 0.05 MG/ML IJ SOLN
INTRAMUSCULAR | Status: DC | PRN
  Administered 2014-05-21: 50 ug via INTRAVENOUS
  Administered 2014-05-21: 25 ug via INTRAVENOUS
  Administered 2014-05-21 (×3): 50 ug via INTRAVENOUS
  Administered 2014-05-21: 25 ug via INTRAVENOUS
  Administered 2014-05-21: 100 ug via INTRAVENOUS

## 2014-05-21 MED ORDER — DEXAMETHASONE SODIUM PHOSPHATE 4 MG/ML IJ SOLN
INTRAMUSCULAR | Status: DC | PRN
  Administered 2014-05-21: 8 mg via INTRAVENOUS

## 2014-05-21 MED ORDER — SCOPOLAMINE 1 MG/3DAYS TD PT72
1.0000 | MEDICATED_PATCH | Freq: Once | TRANSDERMAL | Status: DC
  Administered 2014-05-21: 1.5 mg via TRANSDERMAL
  Filled 2014-05-21: qty 1

## 2014-05-21 MED ORDER — OXYCODONE HCL 5 MG/5ML PO SOLN
5.0000 mg | Freq: Once | ORAL | Status: AC | PRN
  Administered 2014-05-21: 5 mg via ORAL

## 2014-05-21 MED ORDER — PANTOPRAZOLE SODIUM 40 MG PO TBEC
40.0000 mg | DELAYED_RELEASE_TABLET | Freq: Every day | ORAL | Status: DC
  Administered 2014-05-22 – 2014-05-23 (×2): 40 mg via ORAL
  Filled 2014-05-21 (×2): qty 1

## 2014-05-21 MED ORDER — OXYCODONE HCL 5 MG/5ML PO SOLN
ORAL | Status: AC
  Filled 2014-05-21: qty 5

## 2014-05-21 MED ORDER — INSULIN ASPART 100 UNIT/ML ~~LOC~~ SOLN: 0.0000 [IU] | Freq: Every day | SUBCUTANEOUS | Status: DC

## 2014-05-21 MED ORDER — SODIUM CHLORIDE 0.9 % IV SOLN
Freq: Once | INTRAVENOUS | Status: AC
  Administered 2014-05-21: 14:00:00
  Filled 2014-05-21: qty 1

## 2014-05-21 MED ORDER — LETROZOLE 2.5 MG PO TABS
2.5000 mg | ORAL_TABLET | Freq: Every day | ORAL | Status: DC
  Administered 2014-05-22 – 2014-05-23 (×2): 2.5 mg via ORAL
  Filled 2014-05-21 (×3): qty 1

## 2014-05-21 MED ORDER — LACTATED RINGERS IV SOLN
INTRAVENOUS | Status: DC
  Administered 2014-05-21 – 2014-05-23 (×5): via INTRAVENOUS

## 2014-05-21 MED ORDER — LIDOCAINE HCL (CARDIAC) 20 MG/ML IV SOLN
INTRAVENOUS | Status: DC | PRN
  Administered 2014-05-21: 60 mg via INTRAVENOUS

## 2014-05-21 MED ORDER — ONDANSETRON HCL 4 MG/2ML IJ SOLN
INTRAMUSCULAR | Status: DC | PRN
  Administered 2014-05-21: 4 mg via INTRAVENOUS

## 2014-05-21 MED ORDER — OXYCODONE HCL 5 MG PO TABS: 5.0000 mg | ORAL_TABLET | Freq: Once | ORAL | Status: AC | PRN

## 2014-05-21 MED ORDER — LIDOCAINE HCL (CARDIAC) 20 MG/ML IV SOLN
INTRAVENOUS | Status: AC
  Filled 2014-05-21: qty 5

## 2014-05-21 MED ORDER — CYCLOBENZAPRINE HCL 10 MG PO TABS: 10.0000 mg | ORAL_TABLET | Freq: Three times a day (TID) | ORAL | Status: DC | PRN

## 2014-05-21 MED ORDER — 0.9 % SODIUM CHLORIDE (POUR BTL) OPTIME
TOPICAL | Status: DC | PRN
  Administered 2014-05-21: 2000 mL

## 2014-05-21 MED ORDER — DOCUSATE SODIUM 100 MG PO CAPS
100.0000 mg | ORAL_CAPSULE | Freq: Every day | ORAL | Status: DC
  Administered 2014-05-21 – 2014-05-23 (×3): 100 mg via ORAL
  Filled 2014-05-21 (×3): qty 1

## 2014-05-21 MED ORDER — MIDAZOLAM HCL 5 MG/5ML IJ SOLN
INTRAMUSCULAR | Status: DC | PRN
  Administered 2014-05-21: 2 mg via INTRAVENOUS

## 2014-05-21 MED ORDER — INSULIN ASPART 100 UNIT/ML ~~LOC~~ SOLN
0.0000 [IU] | Freq: Three times a day (TID) | SUBCUTANEOUS | Status: DC
  Administered 2014-05-22 – 2014-05-23 (×3): 3 [IU] via SUBCUTANEOUS

## 2014-05-21 MED ORDER — GLYCOPYRROLATE 0.2 MG/ML IJ SOLN
INTRAMUSCULAR | Status: AC
  Filled 2014-05-21: qty 3

## 2014-05-21 MED ORDER — INSULIN ASPART 100 UNIT/ML ~~LOC~~ SOLN
6.0000 [IU] | Freq: Three times a day (TID) | SUBCUTANEOUS | Status: DC
  Administered 2014-05-22 – 2014-05-23 (×4): 6 [IU] via SUBCUTANEOUS

## 2014-05-21 MED ORDER — VANCOMYCIN HCL IN DEXTROSE 1-5 GM/200ML-% IV SOLN
1000.0000 mg | Freq: Two times a day (BID) | INTRAVENOUS | Status: DC
  Administered 2014-05-22 – 2014-05-23 (×3): 1000 mg via INTRAVENOUS
  Filled 2014-05-21 (×4): qty 200

## 2014-05-21 MED ORDER — KETOROLAC TROMETHAMINE 30 MG/ML IJ SOLN
30.0000 mg | Freq: Once | INTRAMUSCULAR | Status: AC | PRN
  Administered 2014-05-21: 30 mg via INTRAVENOUS

## 2014-05-21 MED ORDER — PROMETHAZINE HCL 25 MG/ML IJ SOLN
6.2500 mg | INTRAMUSCULAR | Status: DC | PRN
Start: 2014-05-21 — End: 2014-05-21

## 2014-05-21 MED ORDER — NEOSTIGMINE METHYLSULFATE 10 MG/10ML IV SOLN
INTRAVENOUS | Status: DC | PRN
  Administered 2014-05-21: 4 mg via INTRAVENOUS

## 2014-05-21 MED ORDER — HYDROMORPHONE HCL 1 MG/ML IJ SOLN
0.5000 mg | INTRAMUSCULAR | Status: DC | PRN
  Administered 2014-05-21 – 2014-05-22 (×3): 0.5 mg via INTRAVENOUS
  Filled 2014-05-21 (×3): qty 1

## 2014-05-21 MED ORDER — PHENYLEPHRINE HCL 10 MG/ML IJ SOLN
INTRAMUSCULAR | Status: DC | PRN
  Administered 2014-05-21 (×5): 40 ug via INTRAVENOUS
  Administered 2014-05-21: 80 ug via INTRAVENOUS
  Administered 2014-05-21 (×3): 40 ug via INTRAVENOUS

## 2014-05-21 MED ORDER — HYDROMORPHONE HCL 2 MG PO TABS
2.0000 mg | ORAL_TABLET | ORAL | Status: DC | PRN
Start: 2014-05-21 — End: 2014-05-23
  Administered 2014-05-22: 2 mg via ORAL
  Administered 2014-05-22 – 2014-05-23 (×3): 4 mg via ORAL
  Administered 2014-05-23: 2 mg via ORAL
  Filled 2014-05-21 (×2): qty 2
  Filled 2014-05-21: qty 1
  Filled 2014-05-21: qty 2
  Filled 2014-05-21: qty 1
  Filled 2014-05-21: qty 2

## 2014-05-21 MED ORDER — LACTATED RINGERS IV SOLN
INTRAVENOUS | Status: DC
  Administered 2014-05-21 (×3): via INTRAVENOUS

## 2014-05-21 MED ORDER — ALPRAZOLAM 0.5 MG PO TABS: 1.0000 mg | ORAL_TABLET | Freq: Two times a day (BID) | ORAL | Status: DC | PRN

## 2014-05-21 MED ORDER — VENLAFAXINE HCL ER 150 MG PO CP24
150.0000 mg | ORAL_CAPSULE | Freq: Every day | ORAL | Status: DC
Start: 2014-05-22 — End: 2014-05-23
  Administered 2014-05-22 – 2014-05-23 (×2): 150 mg via ORAL
  Filled 2014-05-21 (×3): qty 1
  Filled 2014-05-21: qty 2

## 2014-05-21 MED ORDER — ROCURONIUM BROMIDE 50 MG/5ML IV SOLN
INTRAVENOUS | Status: AC
  Filled 2014-05-21: qty 1

## 2014-05-21 MED ORDER — METFORMIN HCL 500 MG PO TABS
500.0000 mg | ORAL_TABLET | Freq: Every day | ORAL | Status: DC
  Administered 2014-05-22 – 2014-05-23 (×2): 500 mg via ORAL
  Filled 2014-05-21 (×3): qty 1

## 2014-05-21 MED ORDER — NEOSTIGMINE METHYLSULFATE 10 MG/10ML IV SOLN
INTRAVENOUS | Status: AC
  Filled 2014-05-21: qty 1

## 2014-05-21 MED ORDER — ROCURONIUM BROMIDE 100 MG/10ML IV SOLN
INTRAVENOUS | Status: DC | PRN
  Administered 2014-05-21: 20 mg via INTRAVENOUS
  Administered 2014-05-21 (×3): 10 mg via INTRAVENOUS
  Administered 2014-05-21: 40 mg via INTRAVENOUS
  Administered 2014-05-21: 10 mg via INTRAVENOUS

## 2014-05-21 MED ORDER — HYDROMORPHONE HCL 1 MG/ML IJ SOLN: 0.2500 mg | INTRAMUSCULAR | Status: DC | PRN

## 2014-05-21 MED ORDER — KETOROLAC TROMETHAMINE 30 MG/ML IJ SOLN
INTRAMUSCULAR | Status: AC
  Filled 2014-05-21: qty 1

## 2014-05-21 MED ORDER — VANCOMYCIN HCL IN DEXTROSE 1-5 GM/200ML-% IV SOLN
INTRAVENOUS | Status: AC
  Filled 2014-05-21: qty 200

## 2014-05-21 MED ORDER — MIDAZOLAM HCL 2 MG/2ML IJ SOLN
INTRAMUSCULAR | Status: AC
  Filled 2014-05-21: qty 2

## 2014-05-21 MED ORDER — HEPARIN SODIUM (PORCINE) 5000 UNIT/ML IJ SOLN
5000.0000 [IU] | Freq: Three times a day (TID) | INTRAMUSCULAR | Status: DC
  Administered 2014-05-22 – 2014-05-23 (×4): 5000 [IU] via SUBCUTANEOUS
  Filled 2014-05-21 (×7): qty 1

## 2014-05-21 MED ORDER — HEPARIN SODIUM (PORCINE) 5000 UNIT/ML IJ SOLN
INTRAMUSCULAR | Status: AC
  Administered 2014-05-21: 5000 [IU] via SUBCUTANEOUS
  Filled 2014-05-21: qty 1

## 2014-05-21 SURGICAL SUPPLY — 84 items
APPLIER CLIP 9.375 MED OPEN (MISCELLANEOUS) ×3
ATCH SMKEVC FLXB CAUT HNDSWH (FILTER) ×2 IMPLANT
BAG DECANTER FOR FLEXI CONT (MISCELLANEOUS) ×3 IMPLANT
BINDER BREAST LRG (GAUZE/BANDAGES/DRESSINGS) IMPLANT
BINDER BREAST XLRG (GAUZE/BANDAGES/DRESSINGS) ×3 IMPLANT
BINDER BREAST XXLRG (GAUZE/BANDAGES/DRESSINGS) IMPLANT
BIOPATCH RED 1 DISK 7.0 (GAUZE/BANDAGES/DRESSINGS) ×2 IMPLANT
BIOPATCH RED 1IN DISK 7.0MM (GAUZE/BANDAGES/DRESSINGS) ×1
BLADE SURG 15 STRL LF DISP TIS (BLADE) ×1 IMPLANT
BLADE SURG 15 STRL SS (BLADE) ×2
CANISTER SUCTION 2500CC (MISCELLANEOUS) ×3 IMPLANT
CHLORAPREP W/TINT 26ML (MISCELLANEOUS) ×6 IMPLANT
CLIP APPLIE 9.375 MED OPEN (MISCELLANEOUS) ×1 IMPLANT
CONT SPEC 4OZ CLIKSEAL STRL BL (MISCELLANEOUS) ×3 IMPLANT
COVER SURGICAL LIGHT HANDLE (MISCELLANEOUS) ×3 IMPLANT
DERMABOND ADVANCED (GAUZE/BANDAGES/DRESSINGS) ×2
DERMABOND ADVANCED .7 DNX12 (GAUZE/BANDAGES/DRESSINGS) ×1 IMPLANT
DRAIN CHANNEL 19F RND (DRAIN) ×9 IMPLANT
DRAPE INCISE 23X17 IOBAN STRL (DRAPES) ×2
DRAPE INCISE IOBAN 23X17 STRL (DRAPES) ×1 IMPLANT
DRAPE ORTHO SPLIT 77X108 STRL (DRAPES) ×8
DRAPE PROXIMA HALF (DRAPES) ×6 IMPLANT
DRAPE SURG 17X23 STRL (DRAPES) ×12 IMPLANT
DRAPE SURG ORHT 6 SPLT 77X108 (DRAPES) ×4 IMPLANT
DRAPE WARM FLUID 44X44 (DRAPE) ×3 IMPLANT
DRSG PAD ABDOMINAL 8X10 ST (GAUZE/BANDAGES/DRESSINGS) ×3 IMPLANT
DRSG SORBAVIEW 3.5X5-5/16 MED (GAUZE/BANDAGES/DRESSINGS) ×3 IMPLANT
DRSG TEGADERM 4X4.75 (GAUZE/BANDAGES/DRESSINGS) IMPLANT
ELECT BLADE 6.5 EXT (BLADE) IMPLANT
ELECT CAUTERY BLADE 6.4 (BLADE) ×6 IMPLANT
ELECT REM PT RETURN 9FT ADLT (ELECTROSURGICAL) ×3
ELECTRODE REM PT RTRN 9FT ADLT (ELECTROSURGICAL) ×1 IMPLANT
EVACUATOR SILICONE 100CC (DRAIN) ×9 IMPLANT
EVACUATOR SMOKE ACCUVAC VALLEY (FILTER) ×4
GAUZE SPONGE 4X4 12PLY STRL (GAUZE/BANDAGES/DRESSINGS) IMPLANT
GAUZE XEROFORM 5X9 LF (GAUZE/BANDAGES/DRESSINGS) IMPLANT
GLOVE BIO SURGEON STRL SZ 6.5 (GLOVE) ×2 IMPLANT
GLOVE BIO SURGEON STRL SZ7.5 (GLOVE) ×12 IMPLANT
GLOVE BIO SURGEONS STRL SZ 6.5 (GLOVE) ×1
GLOVE BIOGEL PI IND STRL 6.5 (GLOVE) ×2 IMPLANT
GLOVE BIOGEL PI IND STRL 7.0 (GLOVE) ×1 IMPLANT
GLOVE BIOGEL PI IND STRL 7.5 (GLOVE) ×1 IMPLANT
GLOVE BIOGEL PI IND STRL 8 (GLOVE) ×2 IMPLANT
GLOVE BIOGEL PI INDICATOR 6.5 (GLOVE) ×4
GLOVE BIOGEL PI INDICATOR 7.0 (GLOVE) ×2
GLOVE BIOGEL PI INDICATOR 7.5 (GLOVE) ×2
GLOVE BIOGEL PI INDICATOR 8 (GLOVE) ×4
GLOVE ECLIPSE 7.5 STRL STRAW (GLOVE) ×3 IMPLANT
GLOVE SURG SS PI 6.5 STRL IVOR (GLOVE) ×6 IMPLANT
GLOVE SURG SS PI 7.0 STRL IVOR (GLOVE) ×3 IMPLANT
GOWN STRL REUS W/ TWL LRG LVL3 (GOWN DISPOSABLE) ×4 IMPLANT
GOWN STRL REUS W/ TWL XL LVL3 (GOWN DISPOSABLE) ×2 IMPLANT
GOWN STRL REUS W/TWL LRG LVL3 (GOWN DISPOSABLE) ×8
GOWN STRL REUS W/TWL XL LVL3 (GOWN DISPOSABLE) ×4
IMPLANT GEL 450CC (Breast) ×3 IMPLANT
KIT BASIN OR (CUSTOM PROCEDURE TRAY) ×3 IMPLANT
KIT ROOM TURNOVER OR (KITS) ×3 IMPLANT
MARKER SKIN DUAL TIP RULER LAB (MISCELLANEOUS) ×3 IMPLANT
MENTOR MEMORYGEL RETERILIZABLE GEL SIZER HIGH PROFILE 550CC ×3 IMPLANT
NS IRRIG 1000ML POUR BTL (IV SOLUTION) ×6 IMPLANT
PACK GENERAL/GYN (CUSTOM PROCEDURE TRAY) ×3 IMPLANT
PAD ABD 8X10 STRL (GAUZE/BANDAGES/DRESSINGS) ×3 IMPLANT
PAD ARMBOARD 7.5X6 YLW CONV (MISCELLANEOUS) ×6 IMPLANT
PENCIL BUTTON HOLSTER BLD 10FT (ELECTRODE) ×6 IMPLANT
PREFILTER EVAC NS 1 1/3-3/8IN (MISCELLANEOUS) ×3 IMPLANT
SIZER BREAST 450CC (SIZER) ×2
SIZER BRST P5.1XHI 450CC (SIZER) ×1 IMPLANT
SOL PREP POV-IOD 4OZ 10% (MISCELLANEOUS) ×3 IMPLANT
SPONGE LAP 18X18 X RAY DECT (DISPOSABLE) ×3 IMPLANT
STAPLER VISISTAT 35W (STAPLE) ×3 IMPLANT
SUT MNCRL AB 3-0 PS2 18 (SUTURE) ×18 IMPLANT
SUT MON AB 2-0 CT1 36 (SUTURE) IMPLANT
SUT PDS AB 0 CT 36 (SUTURE) ×6 IMPLANT
SUT PROLENE 3 0 PS 1 (SUTURE) ×12 IMPLANT
SUT VIC AB 3-0 FS2 27 (SUTURE) IMPLANT
SUT VIC AB 3-0 SH 8-18 (SUTURE) ×6 IMPLANT
SYR 50ML SLIP (SYRINGE) IMPLANT
SYR BULB IRRIGATION 50ML (SYRINGE) ×6 IMPLANT
TAPE CLOTH SURG 6X10 WHT LF (GAUZE/BANDAGES/DRESSINGS) ×3 IMPLANT
TOWEL OR 17X24 6PK STRL BLUE (TOWEL DISPOSABLE) ×6 IMPLANT
TOWEL OR 17X26 10 PK STRL BLUE (TOWEL DISPOSABLE) ×3 IMPLANT
TRAY FOLEY CATH 14FRSI W/METER (CATHETERS) ×3 IMPLANT
TUBE CONNECTING 12'X1/4 (SUCTIONS) ×3
TUBE CONNECTING 12X1/4 (SUCTIONS) ×6 IMPLANT

## 2014-05-21 NOTE — Transfer of Care (Signed)
Immediate Anesthesia Transfer of Care Note  Patient: Katherine Acosta  Procedure(s) Performed: Procedure(s): LATISSIMUS FLAP TO LEFT BREAST WITH PLACEMENT OF IMPLANT FOR RECONSTRUCTION (Left)  Patient Location: PACU  Anesthesia Type:General  Level of Consciousness: awake, alert  and oriented  Airway & Oxygen Therapy: Patient Spontanous Breathing and Patient connected to nasal cannula oxygen  Post-op Assessment: Report given to PACU RN and Post -op Vital signs reviewed and stable  Post vital signs: Reviewed and stable  Complications: No apparent anesthesia complications   Pt denies pain.

## 2014-05-21 NOTE — Progress Notes (Signed)
ANTIBIOTIC CONSULT NOTE - INITIAL  Pharmacy Consult for vancomycin Indication: surgical prophylaxis  Allergies  Allergen Reactions  . Tape Itching and Other (See Comments)    Adhesive (plastic tape) causes itching and skin to peel    Patient Measurements: Height: 5\' 2"  (157.5 cm) Weight: 183 lb 12.8 oz (83.371 kg) IBW/kg (Calculated) : 50.1  Vital Signs: Temp: 98 F (36.7 C) (01/21 1804) Temp Source: Oral (01/21 1128) BP: 156/93 mmHg (01/21 1830) Pulse Rate: 93 (01/21 1830) Intake/Output from previous day:   Intake/Output from this shift:    Labs:  Recent Labs  05/21/14 1149  WBC 5.4  HGB 13.0  PLT 236  CREATININE 0.63   Estimated Creatinine Clearance: 79.5 mL/min (by C-G formula based on Cr of 0.63). No results for input(s): VANCOTROUGH, VANCOPEAK, VANCORANDOM, GENTTROUGH, GENTPEAK, GENTRANDOM, TOBRATROUGH, TOBRAPEAK, TOBRARND, AMIKACINPEAK, AMIKACINTROU, AMIKACIN in the last 72 hours.   Microbiology: No results found for this or any previous visit (from the past 720 hour(s)).  Medical History: Past Medical History  Diagnosis Date  . Hypertension   . Diabetes mellitus without complication     type 2  . Anxiety   . Depression   . Headache     migraines  . Cancer     left breast cancer - march, 2015  . Anemia     low iron  . History of hiatal hernia   . Diarrhea   . PONV (postoperative nausea and vomiting)     also has difficulty waking up    Medications:  Prescriptions prior to admission  Medication Sig Dispense Refill Last Dose  . aspirin 81 MG tablet Take 81 mg by mouth.   05/20/2014 at Unknown time  . benazepril (LOTENSIN) 20 MG tablet Take 20 mg by mouth daily.    05/20/2014 at Unknown time  . letrozole (FEMARA) 2.5 MG tablet Take 2.5 mg by mouth daily.   05/21/2014 at 0800  . metFORMIN (GLUCOPHAGE) 500 MG tablet Take 500 mg by mouth daily with breakfast.   05/20/2014 at 2100  . pantoprazole (PROTONIX) 40 MG tablet Take 40 mg by mouth daily.     05/21/2014 at 0800  . venlafaxine XR (EFFEXOR-XR) 150 MG 24 hr capsule Take 150 mg by mouth daily with breakfast.   05/20/2014 at Unknown time  . ALPRAZolam (XANAX) 1 MG tablet Take 1 mg by mouth 2 (two) times daily as needed for anxiety.    More than a month at Unknown time  . cyclobenzaprine (FLEXERIL) 10 MG tablet Take 10 mg by mouth 3 (three) times daily as needed for muscle spasms.    More than a month at Unknown time   Scheduled:  . benazepril  20 mg Oral Daily  . docusate sodium  100 mg Oral Daily  . [START ON 05/22/2014] heparin subcutaneous  5,000 Units Subcutaneous 3 times per day  . [START ON 05/22/2014] insulin aspart  0-20 Units Subcutaneous TID WC  . insulin aspart  0-5 Units Subcutaneous QHS  . [START ON 05/22/2014] insulin aspart  6 Units Subcutaneous TID WC  . ketorolac      . [START ON 05/22/2014] letrozole  2.5 mg Oral Daily  . [START ON 05/22/2014] metFORMIN  500 mg Oral Q breakfast  . oxyCODONE      . [START ON 05/22/2014] pantoprazole  40 mg Oral Daily  . vancomycin      . [START ON 05/22/2014] venlafaxine XR  150 mg Oral Q breakfast    Assessment: 54 yo female  with breast cancer s/p L breast reconstruction with drains in place. She is to begin vancomycin for surgical prophylaxis.  WBC= 5.4, afebrile, SCr= 0.63 and CrCl ~ 80-90. Vancomycin 1000mg  IV was given at about 2pm today.   Goal of Therapy:  Vancomycin trough level 10-15 mcg/ml  Plan:  -Vancomycin 1000mg  IV q12 -Will follow for drain removal -Will follow renal function, cultures and clinical progress  Hildred Laser, Pharm D 05/21/2014 7:34 PM

## 2014-05-21 NOTE — Anesthesia Procedure Notes (Signed)
Procedure Name: Intubation Date/Time: 05/21/2014 1:52 PM Performed by: Julian Reil Pre-anesthesia Checklist: Patient identified, Emergency Drugs available, Suction available and Patient being monitored Patient Re-evaluated:Patient Re-evaluated prior to inductionOxygen Delivery Method: Circle system utilized Preoxygenation: Pre-oxygenation with 100% oxygen Intubation Type: IV induction Ventilation: Mask ventilation without difficulty Laryngoscope Size: Mac and 3 Grade View: Grade II Tube type: Oral Tube size: 7.0 mm Number of attempts: 1 Airway Equipment and Method: Stylet Placement Confirmation: ETT inserted through vocal cords under direct vision,  positive ETCO2 and breath sounds checked- equal and bilateral Secured at: 21 cm Tube secured with: Tape Dental Injury: Teeth and Oropharynx as per pre-operative assessment

## 2014-05-21 NOTE — Anesthesia Preprocedure Evaluation (Addendum)
Anesthesia Evaluation  Patient identified by MRN, date of birth, ID band Patient awake    Reviewed: Allergy & Precautions, NPO status , Patient's Chart, lab work & pertinent test results  History of Anesthesia Complications (+) PONV and PROLONGED EMERGENCE  Airway Mallampati: III  TM Distance: >3 FB Neck ROM: Full    Dental  (+) Teeth Intact, Dental Advisory Given   Pulmonary neg pulmonary ROS,          Cardiovascular hypertension, Pt. on medications     Neuro/Psych  Headaches, PSYCHIATRIC DISORDERS Anxiety Depression    GI/Hepatic Neg liver ROS, hiatal hernia, GERD-  ,Esophageal stricture   Endo/Other  diabetes, Type 2, Oral Hypoglycemic AgentsMorbid obesity  Renal/GU negative Renal ROS     Musculoskeletal   Abdominal   Peds  Hematology negative hematology ROS (+)   Anesthesia Other Findings   Reproductive/Obstetrics                           Anesthesia Physical Anesthesia Plan  ASA: III  Anesthesia Plan: General   Post-op Pain Management:    Induction: Intravenous  Airway Management Planned: Oral ETT  Additional Equipment:   Intra-op Plan:   Post-operative Plan: Extubation in OR  Informed Consent: I have reviewed the patients History and Physical, chart, labs and discussed the procedure including the risks, benefits and alternatives for the proposed anesthesia with the patient or authorized representative who has indicated his/her understanding and acceptance.   Dental advisory given  Plan Discussed with: CRNA  Anesthesia Plan Comments:        Anesthesia Quick Evaluation

## 2014-05-21 NOTE — H&P (Signed)
I have re-examined and re-evaluated the patient and there are no changes.  See H&P in paper chart.  Planned procedure: Wound excision left chest and left breast reconstruction with latissimus flap and silicone gel implant

## 2014-05-21 NOTE — Brief Op Note (Signed)
05/21/2014  6:18 PM  PATIENT:  Katherine Acosta  54 y.o. female  PRE-OPERATIVE DIAGNOSIS:  BREAST CANCER  POST-OPERATIVE DIAGNOSIS:  BREAST CANCER  PROCEDURE:  Procedure(s): LATISSIMUS FLAP TO LEFT BREAST WITH PLACEMENT OF IMPLANT FOR RECONSTRUCTION (Left)  SURGEON:  Surgeon(s) and Role:    * Crissie Reese, MD - Primary  PHYSICIAN ASSISTANT:   ASSISTANTS: Judyann Munson, RNFA   ANESTHESIA:   general  EBL:  Total I/O In: 2400 [I.V.:2400] Out: 47 [Urine:705; Blood:100]  BLOOD ADMINISTERED:none  DRAINS: (3) Jackson-Pratt drain(s) with closed bulb suction in the left back donor site (2) and left chest (1)   LOCAL MEDICATIONS USED:  NONE  SPECIMEN:  Source of Specimen:  left mastectomy scar and portion of mastectomy skin flap  DISPOSITION OF SPECIMEN:  PATHOLOGY  COUNTS:  YES  TOURNIQUET:  * No tourniquets in log *  DICTATION: .Other Dictation: Dictation Number [id number]  PLAN OF CARE: Admit to inpatient   PATIENT DISPOSITION:  PACU - hemodynamically stable.   Delay start of Pharmacological VTE agent (>24hrs) due to surgical blood loss or risk of bleeding: no

## 2014-05-22 ENCOUNTER — Encounter (HOSPITAL_COMMUNITY): Payer: Self-pay | Admitting: General Practice

## 2014-05-22 LAB — GLUCOSE, CAPILLARY
GLUCOSE-CAPILLARY: 110 mg/dL — AB (ref 70–99)
GLUCOSE-CAPILLARY: 146 mg/dL — AB (ref 70–99)
Glucose-Capillary: 150 mg/dL — ABNORMAL HIGH (ref 70–99)
Glucose-Capillary: 143 mg/dL — ABNORMAL HIGH (ref 70–99)
Glucose-Capillary: 108 mg/dL — ABNORMAL HIGH (ref 70–99)

## 2014-05-22 MED ORDER — METHOCARBAMOL 500 MG PO TABS
500.0000 mg | ORAL_TABLET | Freq: Four times a day (QID) | ORAL | Status: DC
  Administered 2014-05-22 – 2014-05-23 (×6): 500 mg via ORAL
  Filled 2014-05-22 (×9): qty 1

## 2014-05-22 NOTE — Anesthesia Postprocedure Evaluation (Signed)
Anesthesia Post Note  Patient: Lobbyist  Procedure(s) Performed: Procedure(s) (LRB): LATISSIMUS FLAP TO LEFT BREAST WITH PLACEMENT OF IMPLANT FOR RECONSTRUCTION (Left)  Anesthesia type: General  Patient location: PACU  Post pain: Pain level controlled  Post assessment: Post-op Vital signs reviewed  Last Vitals: BP 98/59 mmHg  Pulse 100  Temp(Src) 37.1 C (Oral)  Resp 18  Ht 5\' 2"  (1.575 m)  Wt 183 lb 12.8 oz (83.371 kg)  BMI 33.61 kg/m2  SpO2 96%  Post vital signs: Reviewed  Level of consciousness: sedated  Complications: No apparent anesthesia complications

## 2014-05-22 NOTE — Op Note (Signed)
Katherine Acosta, Katherine Acosta                 ACCOUNT NO.:  [id number]  MEDICAL RECORD NO.:  [id number]  LOCATION:  6N28C                        FACILITY:  [facility]  PHYSICIAN:  Crissie Reese, M.D.     DATE OF BIRTH:  1960-06-12  DATE OF PROCEDURE:  05/21/2014 DATE OF DISCHARGE:                              OPERATIVE REPORT   PREOPERATIVE DIAGNOSES: 1. Left breast cancer. 2. Wound dehiscence of left mastectomy scar with exposed underlying     implant.  POSTOPERATIVE DIAGNOSES: 1. Left breast cancer. 2. Wound dehiscence of left mastectomy scar with exposed underlying     implant.  PROCEDURE PERFORMED: 1. Left latissimus myocutaneous flap. 2. Removal of left implant. 3. Delayed breast reconstruction with a silicone gel implant.  SURGEON:  Crissie Reese, MD  ASSISTANT:  Judyann Munson, RNFA.  ESTIMATED BLOOD LOSS:  75 mL.  DRAINS:  Three 19-French. 2 in the left back and 1 in the left chest.  CLINICAL NOTE:  This 54 year old woman who was originally patient in [facility] where she had a mastectomy and breast reconstruction, then had a problem with a tissue expander and removed and then had it replaced again with 3 surgeries within about 4 months.  She subsequently transferred her care here to [facility].  Eventually she completed her expansion process and the tissue expander was replaced with an implant about 7 weeks ago.  Tissues were noted to be very thin at that time, probably due to the septal surgery that she had.  She noted a couple days ago that she felt there was a tiny opening along the incision.  She was seen in the office yesterday and under loupe magnification with a headlight, it was felt that there was a 2 mm opening in the incision and the underlying implant was exposed.  This was discussed with her at length and it was recommended that this reconstruction have an attempt at salvage using the latissimus flap with implant and a smaller silicone gel implant with the  transfer of some autogenous tissue from her back. This was discussed with her at length and the rationale for this and she understood there was possibility of infection although there was no evidence of infection at the present time.  She also understood the risks include not limited to, bleeding, infection, healing problems, scarring, loss of sensation, fluid accumulations, anesthesia re- complications, contour deformities, contour deformities of the periphery of the reconstruction, failure of the device, capsular contracture, displacement device, wrinkles and ripples, asymmetry, loss of range of motion or strength in the left arm and shoulder and loss of tissue, loss of skin, loss of the flap and chronic pain as well as overall disappointment.  She understood all this and wished to proceed.  DESCRIPTION OF PROCEDURE:  The patient was taken to the operating room and placed supine after having been marked  for the latissimus flap in a full standing position in the holding area.  After successful induction of anesthesia, she was placed in a right lateral decubitus position and axillary roll was positioned and she was secured with beanbag and strap. She was then prepped with ChloraPrep and draped with sterile drapes including impervious drapes.  The skin paddle was then incised on her back and dissection carried into subcu tissue beveling outward in all directions in order to ensure a broader attachment of subcutaneous tissue at the level of the muscle.  Then, it was present at the level of the skin.  This was done in order to preserve perfusion and vascularity to the skin paddle.  The dissection was continued to the lateral, medial, superior, and inferior borders of the latissimus muscle.  The muscle was then released inferiorly and then reflected in a cephalad direction with meticulous stasis maintained throughout the procedure using electrocautery.  The larger perforators were either  double and triple ligated with Ligaclips or ligated with 3-0 Vicryl suture ligatures.  Once flaps were then mobilized, it was inspected and skin paddle had excellent color and bright red bleeding at its periphery consistent with viability.  Subcutaneous tunnel was then made through to the left chest.  The left chest scar was excised and sent as a specimen to the pathology and this was a generous excision.  The implant was removed and antibiotic solution placed in the space.  The skin was then stapled closed.  The flap was then passed gently through the subcutaneous tunnel and was no tension on the flap and there was no compression on the flap in the tunnel.  Once the flap had been passed safely into the left chest, the tissue attached to the closure.  Great care had already been taken during the mobilization of the flap to preserve the pedicle to the flap and avoid any damage to it.  The drains were then positioned, 2 drains brought through separate stab wounds infer-anteriorly and secured with 3-0 Prolene sutures.  These were left in the back donor site.  The back was irrigated thoroughly with saline as well as antibiotic solution and again meticulous hemostasis with the electrocautery.  Excellent stasis having been ensured, the closure of the defect with 0 PDS interrupted inverted deep sutures followed by 3-0 Monocryl interrupted inverted deep dermal sutures, followed by 3-0 Monocryl running subcuticular suture, and a few reinforcing 3-0 Prolene simple sutures.  Dermabond was applied to the wound.  The drains were dressed with SorbaView and Biopatch placed prior to application of SorbaView.  Dry sterile dressings placed on the incision and this was taped in place.  The patient was then placed supine.  Prior to repositioning and before the removal of the drapes, a sterile Steri-Drape was placed over the stapled wound closure of left chest.  Once she was positioned supine, this was  removed and she was prepped with Betadine and draped with sterile drapes including impervious drapes.  The skin staples were then removed and the flap was inspected, noted to have excellent color and bright red bleeding along the periphery of the skin edges consistent with viability.  Thorough irrigation with saline as well as antibiotic solution.  The subcutaneous tunnel was closed off with 3-0 Vicryl simple interrupted sutures, again great care taken to avoid damage to the pedicle and the muscle was inset along the inframammary fold using 3-0 Vicryl sutures.  A Sizer was positioned.  It was found to be too large, the 550 Sizer and it was decided to drop down to a 450 mL implant to give her a good symmetry with the opposite side in terms of volume. This was 751 mL Mentor silicone gel moderate plus profile implant.  This was soaked in antibiotic solution for greater than 5 minutes and antibiotic solution also placed  in the space.  A 19-French drain was positioned and brought through separate stab wound inferolaterally and secured with 3-0 Prolene suture.  After thoroughly cleaning gloves, the implant was positioned and the superior border of the latissimus muscle was then sutured to the pectoralis muscle well beyond the superior skin edge in order to achieve a double muscle closure in this region.  This achieved excellent submuscular coverage of the entire implant.  The skin paddle continued good color.  Insetting was performed using 3-0 Monocryl interrupted inverted deep dermal sutures with a couple of 3-0 Prolene sutures at the corners.  The tip of the flap was viable.  It was redundant also.  It was de-epithelialized and there was a little bit of depression at the inferomedial aspect of the reconstruction that this filled very nicely.  The depression was noted when the bulb was charged and placed under some negative pressure.  This tissue was ablated nicely with deepithelialization and  it was felt it was viable and was served as an excellent fill for this area of vascularized fat serving as a good means of correction of this slight contour deformity.  The closure having been completed, skin paddle continued to have excellent color.  Dermabond was applied to the wound and the drain was dressed with a Biopatch and SorbaView dressing.  Dry sterile dressings and a chest vest positioned and she was transferred to the recovery room in stable having tolerated the procedure well.     Crissie Reese, M.D.     DB/MEDQ  D:  05/21/2014  T:  05/22/2014  Job:  [id number]

## 2014-05-22 NOTE — Progress Notes (Signed)
Subjective: Sore. Pain control is excellent. No nausea!   Objective: Vital signs in last 24 hours: Temp:  [97.1 F (36.2 C)-98.6 F (37 C)] 98.6 F (37 C) (01/22 0445) Pulse Rate:  [91-106] 91 (01/22 0445) Resp:  [17-21] 18 (01/22 0445) BP: (104-168)/(70-99) 104/72 mmHg (01/22 0445) SpO2:  [90 %-98 %] 97 % (01/22 0445) Weight:  [183 lb 12.8 oz (83.371 kg)] 183 lb 12.8 oz (83.371 kg) (01/21 1128)  Intake/Output from previous day: 01/21 0701 - 01/22 0700 In: 2400 [I.V.:2400] Out: 2633 [Urine:2405; Drains:128; Blood:100] Intake/Output this shift:    Operative sites: Mastectomy flaps viable. Flap viable. Good color throughout skin paddle. Implant is in good position. Drains functioning. Drainage thin. There is no evidenc of bleeding or infection.   Recent Labs  05/21/14 1149  WBC 5.4  HGB 13.0  HCT 36.6  NA 140  K 3.8  CL 105  CO2 23  BUN 6  CREATININE 0.63    Studies/Results: CBG under good control.  Assessment/Plan: Ambulate. Continue DVT and antibiotic prophylaxis. Advance diet.   LOS: 1 day    Katherine Acosta M 05/22/2014 7:29 AM

## 2014-05-22 NOTE — Progress Notes (Signed)
Foley cath d/c'd as ordered. Awaiting to void.

## 2014-05-23 LAB — GLUCOSE, CAPILLARY
GLUCOSE-CAPILLARY: 112 mg/dL — AB (ref 70–99)
Glucose-Capillary: 137 mg/dL — ABNORMAL HIGH (ref 70–99)

## 2014-05-23 MED ORDER — HYDROMORPHONE HCL 2 MG PO TABS: 2.0000 mg | ORAL_TABLET | ORAL | Status: DC | PRN

## 2014-05-23 MED ORDER — DOXYCYCLINE HYCLATE 100 MG PO TABS
100.0000 mg | ORAL_TABLET | Freq: Two times a day (BID) | ORAL | Status: DC
  Administered 2014-05-23: 100 mg via ORAL
  Filled 2014-05-23: qty 1

## 2014-05-23 MED ORDER — DSS 100 MG PO CAPS: 100.0000 mg | ORAL_CAPSULE | Freq: Every day | ORAL | Status: DC

## 2014-05-23 MED ORDER — ENOXAPARIN SODIUM 40 MG/0.4ML ~~LOC~~ SOLN: 40.0000 mg | SUBCUTANEOUS | Status: DC

## 2014-05-23 MED ORDER — METHOCARBAMOL 500 MG PO TABS: 500.0000 mg | ORAL_TABLET | Freq: Four times a day (QID) | ORAL | Status: DC

## 2014-05-23 MED ORDER — DOXYCYCLINE HYCLATE 100 MG PO TABS: 100.0000 mg | ORAL_TABLET | Freq: Two times a day (BID) | ORAL | Status: DC

## 2014-05-23 MED ORDER — PROMETHAZINE HCL 12.5 MG PO TABS: 12.5000 mg | ORAL_TABLET | Freq: Four times a day (QID) | ORAL | Status: DC | PRN

## 2014-05-23 MED ORDER — ENOXAPARIN SODIUM 40 MG/0.4ML ~~LOC~~ SOLN
40.0000 mg | SUBCUTANEOUS | Status: DC
  Administered 2014-05-23: 40 mg via SUBCUTANEOUS
  Filled 2014-05-23: qty 0.4

## 2014-05-23 NOTE — Discharge Instructions (Addendum)
No lifting for 6 weeks No vigorous activity for 6 weeks (including outdoor walks) No driving for 4 weeks OK to walk up stairs slowly Stay propped up Use incentive spirometer at home every hour while awake No shower while drains are in place Empty drains at least three times a day and record the amounts separately Change drain dressings every third day if instructed to do so by Dr. Harlow Mares  Apply Bacitracin antibiotic ointment to the drain sites  Place gauze dressing over drains  Secure the gauze with tape Take an over-the-counter Probiotic while on antibiotics Take an over-the-counter stool softener (such as Colace) while on pain medication Continue Lovenox injections at home tomorrow afternoon See Dr. Harlow Mares in office next week For questions call [phone] or [phone]

## 2014-05-23 NOTE — Discharge Summary (Signed)
Physician Discharge Summary  Patient ID: Katherine Acosta MRN: [id number] DOB/AGE: [birth year] 56 y.o.  Admit date: 05/21/2014 Discharge date: 05/23/2014  Admission Diagnoses:Left breast cancer and implant exposure left breast reconstruction  Discharge Diagnoses: Same Active Problems:   Breast cancer, left breast   Discharged Condition: good  Hospital Course: On the day of admission the patient was taken to surgery and had removal of left implant, mastectomy scar excision, left latissimus flap, and delayed breast reconstruction with silicone gel implant. The patient tolerated the procedures well. Postoperatively, the flap maintained excellent color and capillary refill. The patient was ambulatory and tolerating diet on the first postoperative day. Second day nausea had resolved. DVT and antibiotic prophylaxis continued until discharge.  Treatments: antibiotics: vancomycin, anticoagulation: heparin and surgery: left chest removal implant, mastectomy scar excision, left latissimus flap, and delayed breast reconstruction left side with silicone gel implant  Discharge Exam: Blood pressure 129/71, pulse 91, temperature 98.5 F (36.9 C), temperature source Oral, resp. rate 17, height 5\' 2"  (1.575 m), weight 183 lb 12.8 oz (83.371 kg), SpO2 91 %.  Operative sites: Mastectomy flaps viable. Flap viable with good color throughout skin paddle. Implant in good position. Drains functioning. Drainage thin. No evidence of bleeding or infection at left chest or back donor site.  Disposition: Discharge home     Medication List    STOP taking these medications        aspirin 81 MG tablet     cyclobenzaprine 10 MG tablet  Commonly known as:  FLEXERIL      TAKE these medications        ALPRAZolam 1 MG tablet  Commonly known as:  XANAX  Take 1 mg by mouth 2 (two) times daily as needed for anxiety.     benazepril 20 MG tablet  Commonly known as:  LOTENSIN  Take 20 mg by mouth daily.     doxycycline 100 MG tablet  Commonly known as:  VIBRA-TABS  Take 1 tablet (100 mg total) by mouth every 12 (twelve) hours.     DSS 100 MG Caps  Take 100 mg by mouth daily.     enoxaparin 40 MG/0.4ML injection  Commonly known as:  LOVENOX  Inject 0.4 mLs (40 mg total) into the skin daily.     HYDROmorphone 2 MG tablet  Commonly known as:  DILAUDID  Take 1-2 tablets (2-4 mg total) by mouth every 4 (four) hours as needed for moderate pain.     letrozole 2.5 MG tablet  Commonly known as:  FEMARA  Take 2.5 mg by mouth daily.     metFORMIN 500 MG tablet  Commonly known as:  GLUCOPHAGE  Take 500 mg by mouth daily with breakfast.     methocarbamol 500 MG tablet  Commonly known as:  ROBAXIN  Take 1 tablet (500 mg total) by mouth 4 (four) times daily.     pantoprazole 40 MG tablet  Commonly known as:  PROTONIX  Take 40 mg by mouth daily.     promethazine 12.5 MG tablet  Commonly known as:  PHENERGAN  Take 1 tablet (12.5 mg total) by mouth every 6 (six) hours as needed for nausea or vomiting.     venlafaxine XR 150 MG 24 hr capsule  Commonly known as:  EFFEXOR-XR  Take 150 mg by mouth daily with breakfast.         Signed: Deserea Bordley M 05/23/2014, 2:02 PM

## 2014-05-23 NOTE — Progress Notes (Signed)
Discharge instructions gone over with patient and spouse. Prescription given and others faxed in. Home medications gone over. Follow up appointment is made. Patient and spouse demonstrated proper care of JP drains and how to empty. Previous experience with this. Diet, activity, arm precautions, incisional care, and reasons to call the doctor gone over. Patient verbalized understanding of instructions.

## 2014-05-25 ENCOUNTER — Encounter (HOSPITAL_COMMUNITY): Payer: Self-pay | Admitting: Plastic Surgery

## 2014-06-03 ENCOUNTER — Telehealth: Payer: Self-pay | Admitting: *Deleted

## 2014-06-03 NOTE — Telephone Encounter (Signed)
Patient called to triage stating that she needs to get an injection appt for Prolia. She states that "I thought someone was going to call me with an appointment but I never heard anything."  She states that she didn't get the injection in January because the drug was not approved yet. Per patient, best callback number is [phone]  Information forwarded to desk to address and return call to patient.

## 2014-06-04 ENCOUNTER — Other Ambulatory Visit: Payer: Self-pay | Admitting: *Deleted

## 2014-06-04 ENCOUNTER — Encounter: Payer: Self-pay | Admitting: *Deleted

## 2014-06-04 ENCOUNTER — Telehealth: Payer: Self-pay | Admitting: *Deleted

## 2014-06-04 ENCOUNTER — Encounter (HOSPITAL_COMMUNITY): Payer: Self-pay | Admitting: Plastic Surgery

## 2014-06-04 NOTE — Progress Notes (Signed)
Received inbasket from Raquel that Prolia is authorized. Sent pof to schedule injection.

## 2014-06-04 NOTE — Telephone Encounter (Signed)
error 

## 2014-06-05 ENCOUNTER — Encounter (HOSPITAL_COMMUNITY): Payer: Self-pay | Admitting: Plastic Surgery

## 2014-06-08 ENCOUNTER — Ambulatory Visit: Payer: BLUE CROSS/BLUE SHIELD

## 2014-06-09 ENCOUNTER — Ambulatory Visit: Payer: BLUE CROSS/BLUE SHIELD

## 2014-06-09 ENCOUNTER — Telehealth: Payer: Self-pay | Admitting: *Deleted

## 2014-06-09 NOTE — Telephone Encounter (Signed)
Katherine Acosta missed her injection appointment.  Called and rescheduled for tomorrow, when she will be in town.  She is aware of d/t.

## 2014-06-10 ENCOUNTER — Ambulatory Visit (HOSPITAL_BASED_OUTPATIENT_CLINIC_OR_DEPARTMENT_OTHER): Payer: BLUE CROSS/BLUE SHIELD

## 2014-06-10 DIAGNOSIS — M8580 Other specified disorders of bone density and structure, unspecified site: Secondary | ICD-10-CM

## 2014-06-10 DIAGNOSIS — C50512 Malignant neoplasm of lower-outer quadrant of left female breast: Secondary | ICD-10-CM

## 2014-06-10 MED ORDER — DENOSUMAB 60 MG/ML ~~LOC~~ SOLN
60.0000 mg | SUBCUTANEOUS | Status: DC
  Administered 2014-06-10: 60 mg via SUBCUTANEOUS
  Filled 2014-06-10: qty 1

## 2014-06-17 ENCOUNTER — Telehealth: Payer: Self-pay | Admitting: *Deleted

## 2014-06-17 NOTE — Telephone Encounter (Signed)
Pt called tearful and requesting counseling. Pt relate she is "willing to do anything at this point". Discussed support group and getting pt in touch with an Alight guide.  Sent referral to SW to discuss counseling options with pt.  Pt denies further needs at this time. Encourage pt to call with questions or concerns. Received verbal understanding.

## 2014-06-23 ENCOUNTER — Encounter: Payer: Self-pay | Admitting: *Deleted

## 2014-06-23 NOTE — Progress Notes (Signed)
[place] Work  Clinical Social Work was referred by patient navigator for emotional support and assessment of psychosocial needs.  Clinical Social Worker contacted patient at home to offer support and assess for needs.  Patient stated she recently spoke with her assigned Alight Guide and was feeling "much better".  Patient described her treatment experience as a "roller coaster ride", and expressed anxiety about returning to work.  CSW and patient discussed common feelings and emotions after completing treatment.  CSW informed patient of the support services available.  Patient is already been assigned and Alight Guide and plans to attend the breast cancer support group.  Patient was also agreeable to CSW making a referral to the [facility] counseling interns.  CSW and patient also discussed the [facility] program, and patient was interested in attending the next session.  CSW provided contact information and encouraged patient to call with any questions or concerns.         Johnnye Lana, MSW, LCSW, OSW-C Clinical Social Worker [facility] [phone]

## 2014-06-26 ENCOUNTER — Encounter: Payer: Self-pay | Admitting: *Deleted

## 2014-06-26 NOTE — Progress Notes (Signed)
Counselor met with pt today and discussed the difficulties with multiple surgeries and diagnosis. Several surgeries were unexpected and pt had difficulty dealing with those, both physically and financially. Expressed concerns about returning to work soon. Seemed overwhelmed, but stated talking was helpful. Scheduled a follow up appointment for 07/02/14.  [place] Mirabelle Cyphers Counseling Intern

## 2014-06-30 ENCOUNTER — Telehealth: Payer: Self-pay | Admitting: *Deleted

## 2014-06-30 ENCOUNTER — Telehealth: Payer: Self-pay

## 2014-06-30 NOTE — Telephone Encounter (Signed)
Pt called re: 3d mammogram which was scheduled early March.  Surgery done on 1/21.  Dr. Harlow Mares let her know he does not want her to have mammogram for 6 months from surgical date of 1/21.  3/4 - Spoke with [facility] MD re: Luna Kitchens without mammo d/t instructions from Dr. Harlow Mares.  MD states she must have prior images to compare to, will need [place] to get prior records.  3/7 - Spoke with Rise Paganini at [facility]/Dr. Cruzita Lederer [phone] fax: [phone].  Per Rise Paganini pt has appt this March and still shows as active patient at their practice.  She will be happy to send records - needs to be notified by patient first and given permission to release records.    3/8 - Spoke with pt and let her know info per [place] above.  Pt stated she will contact [facility], notify them she is transferring care and request records be sent.  Pt will contact us when complete.

## 2014-06-30 NOTE — Telephone Encounter (Signed)
DR.BOWERS TOLD PT. SHE CAN NOT HAVE A 3D MAMMOGRAM DONE FOR SIX MONTHS DUE TO PLASTIC SURGERY. SHOULD PT. RESCHEDULE HER APPOINTMENT WITH [place]? THIS NOTE WAS SENT TO DR.GUDENA'S NURSE, Lonell Grandchild.

## 2014-07-03 ENCOUNTER — Telehealth: Payer: Self-pay | Admitting: Hematology and Oncology

## 2014-07-03 ENCOUNTER — Other Ambulatory Visit: Payer: Self-pay

## 2014-07-03 NOTE — Telephone Encounter (Signed)
Cancel labs/ov per 03/04 POF for 03/08 due to MRI not completed.... KJ

## 2014-07-03 NOTE — Telephone Encounter (Signed)
Patient called asking whether she should keep her appointment with Dr. Lindi Adie on 07/07/14 since she has not been able to have the planned breast imaging.  Spoke with Jonelle Sports who is working on arranging the imaging.  She will cancel the appointment for next week and reschedule as appropriate.  Informed patient that she will here from our office with a new appointment. Appreciative for the advice.

## 2014-07-07 ENCOUNTER — Ambulatory Visit: Payer: BLUE CROSS/BLUE SHIELD | Admitting: Hematology and Oncology

## 2014-07-07 ENCOUNTER — Other Ambulatory Visit: Payer: BLUE CROSS/BLUE SHIELD

## 2014-07-09 ENCOUNTER — Telehealth: Payer: Self-pay | Admitting: *Deleted

## 2014-07-09 ENCOUNTER — Encounter: Payer: Self-pay | Admitting: *Deleted

## 2014-07-09 NOTE — Telephone Encounter (Signed)
Pt left a voice mail stating she got her records from [facility] and left them at the front desk at [facility]. Dr Geralyn Flash nurse notified

## 2014-07-09 NOTE — Progress Notes (Signed)
Met with counselor today and discussed worries regarding returning to work on Monday. Doctor cleared her for work as of this morning. Seems physically ready to go back to work, but expressed a great deal of anxiety about the particular workplace. Counselor offered coping skills; pt will follow up with counselor should she need to schedule another appointment.  [place] Cree Kunert Counseling Intern [phone]

## 2014-07-10 ENCOUNTER — Telehealth: Payer: Self-pay | Admitting: *Deleted

## 2014-07-10 NOTE — Telephone Encounter (Signed)
RN called patient to confirm Jonelle Sports RN has received medical record papers. RN did not leave voicemail.

## 2014-07-10 NOTE — Telephone Encounter (Signed)
Patient called back.  Gave her the message that Jonelle Sports has received her medical records.  That was all she wanted to know.

## 2014-07-13 ENCOUNTER — Other Ambulatory Visit: Payer: Self-pay | Admitting: *Deleted

## 2014-07-17 ENCOUNTER — Ambulatory Visit: Payer: BLUE CROSS/BLUE SHIELD | Attending: Plastic Surgery | Admitting: Physical Therapy

## 2014-07-17 DIAGNOSIS — M6281 Muscle weakness (generalized): Secondary | ICD-10-CM | POA: Diagnosis present

## 2014-07-17 DIAGNOSIS — M25512 Pain in left shoulder: Secondary | ICD-10-CM

## 2014-07-17 DIAGNOSIS — R5381 Other malaise: Secondary | ICD-10-CM | POA: Insufficient documentation

## 2014-07-17 DIAGNOSIS — C50512 Malignant neoplasm of lower-outer quadrant of left female breast: Secondary | ICD-10-CM | POA: Diagnosis not present

## 2014-07-17 NOTE — Therapy (Signed)
[place], [place], [address] Phone: [phone]   Fax:  [phone]  Physical Therapy Evaluation  Patient Details  Name: Katherine Acosta MRN: [id number] Date of Birth: [birth year] Referring Provider:  Crissie Reese, MD  Encounter Date: 07/17/2014      PT End of Session - 07/17/14 0955    Visit Number 1   Number of Visits 9   Date for PT Re-Evaluation 08/21/14   PT Start Time 0800   PT Stop Time 0845   PT Time Calculation (min) 45 min   Activity Tolerance Patient tolerated treatment well   Behavior During Therapy [facility] for tasks assessed/performed      Past Medical History  Diagnosis Date  . Hypertension   . Diabetes mellitus without complication     type 2  . Anxiety   . Depression   . Headache     migraines  . Cancer     left breast cancer - march, 2015  . Anemia     low iron  . History of hiatal hernia   . Diarrhea   . PONV (postoperative nausea and vomiting)     also has difficulty waking up    Past Surgical History  Procedure Laterality Date  . Esophagus surgery      had stomach rewrapped around esophagus  . Tonsillectomy    . Cholecystectomy    . Mastectomy    . Breast surgery      breast reduction on right  . Abdominal hysterectomy    . Colonoscopy    . Knee arthroscopy Left   . Mastectomy Left 07/2013  . Breast reconstruction Left 05/21/2014    DR BOWERS  . Latissimus flap to breast Left 05/21/2014    Procedure: LATISSIMUS FLAP TO LEFT BREAST WITH PLACEMENT OF IMPLANT FOR RECONSTRUCTION;  Surgeon: Crissie Reese, MD;  Location: [place];  Service: Plastics;  Laterality: Left;    There were no vitals filed for this visit.  Visit Diagnosis:  Decreased muscle strength  Physical deconditioning  Pain in joint, shoulder region, left      Subjective Assessment - 07/17/14 0957    Symptoms "I've had 5 reconstruction surgeries on my right side"     Pertinent History Pt reports  complications from reconstrction with expanders, implant that had problems and ultimately  a latt flap.  she reports so history of osteopenia when questioned if she was ever tested for it            [facility] PT Assessment - 07/17/14 0001    Assessment   Medical Diagnosis breast cancer   Onset Date 06/29/13   Precautions   Precautions --  cannot lift over 80#    Restrictions   Weight Bearing Restrictions No   Balance Screen   Has the patient fallen in the past 6 months No   Has the patient had a decrease in activity level because of a fear of falling?  No   Is the patient reluctant to leave their home because of a fear of falling?  No   Home Environment   Living Enviornment Private residence   Living Arrangements Spouse/significant other   Available Help at Discharge Available PRN/intermittently   Type of [place] to enter   Entrance Stairs-Number of Steps 3   Entrance Stairs-Rails Right   Home Layout One level   [place] None   Prior Function   Level of Independence Independent with basic  ADLs;Independent with homemaking with ambulation;Independent with transfers;Independent with gait   Vocation Full time employment   Vocation Requirements typing, data entry, drive cars    Leisure baking, cake decorating, crafting , reading    Cognition   Overall Cognitive Status Within Functional Limits for tasks assessed   Observation/Other Assessments   Observations pt obese with generalized muscle atrophy.  she has soft tissue deficit under left posterior incision   Skin Integrity healing incisions at left posterior trunk and both breasts    Sensation   Additional Comments c/o tingling in in right 5th and 4th  finger, going to see a hand specialist    Coordination   Gross Motor Movements are Fluid and Coordinated Yes   Functional Tests   Functional tests Other  Quick DASH 22.73   Posture/Postural Control   Posture/Postural Control Postural limitations    Postural Limitations Rounded Shoulders;Forward head   ROM / Strength   AROM / PROM / Strength AROM   AROM   Right Shoulder Extension 54 Degrees   Right Shoulder Flexion 167 Degrees   Right Shoulder ABduction 165 Degrees   Right Shoulder Internal Rotation 37 Degrees   Right Shoulder External Rotation 85 Degrees   Left Shoulder Extension 50 Degrees   Left Shoulder Flexion 158 Degrees  pain   Left Shoulder ABduction 160 Degrees  pain   Left Shoulder Internal Rotation 55 Degrees  pain   Left Shoulder External Rotation 85 Degrees   Strength   Overall Strength Deficits   Overall Strength Comments pt reports generalized fatigue with return to work full time   Right Shoulder Flexion 4+/5   Right Shoulder Extension 4+/5   Right Shoulder ABduction 4+/5   Right Shoulder Internal Rotation 4+/5   Right Shoulder External Rotation 4+/5   Left Shoulder Flexion 4-/5  pain with resisted left shoulder movements   Left Shoulder Extension 4-/5   Left Shoulder ABduction 4-/5   Left Shoulder Internal Rotation 4/5   Left Shoulder External Rotation 4-/5   Palpation   Palpation tissue firmness in left scapular and upper trap region            LYMPHEDEMA/ONCOLOGY QUESTIONNAIRE - 07/17/14 0939    Type   Cancer Type breast cancer    Surgeries   Mastectomy Date 07/30/13  with immediate expanders   Other Surgery Date 05/21/14  lat flap    Number Lymph Nodes Removed --  not sure likely sentinel node    Treatment   Past Chemotherapy Treatment No   Past Radiation Treatment No   What other symptoms do you have   Are you Having Heaviness or Tightness Yes   Are you having Pain Yes   Right Upper Extremity Lymphedema   10 cm Proximal to Olecranon Process 36.5 cm   Olecranon Process 29 cm   15 cm Proximal to Ulnar Styloid Process 28 cm   Just Proximal to Ulnar Styloid Process 17.5 cm   Across Hand at [employer] 19.9 cm   At [place] of 2nd Digit 6.2 cm   Left Upper Extremity Lymphedema   10  cm Proximal to Olecranon Process 36.7 cm   Olecranon Process 28.2 cm   15 cm Proximal to Ulnar Styloid Process 27 cm   Just Proximal to Ulnar Styloid Process 16.8 cm   Across Hand at [employer] 19.1 cm   At [place] of 2nd Digit 6 cm           Quick Dash - 07/17/14  0001    Open a tight or new jar Mild difficulty   Do heavy household chores (wash walls, wash floors) Mild difficulty   Carry a shopping bag or briefcase No difficulty   Wash your back No difficulty   Use a knife to cut food No difficulty   Recreational activities in which you take some force or impact through your arm, shoulder, or hand (golf, hammering, tennis) Mild difficulty   During the past week, to what extent has your arm, shoulder or hand problem interfered with your normal social activities with family, friends, neighbors, or groups? Slightly   During the past week, to what extent has your arm, shoulder or hand problem limited your work or other regular daily activities Slightly   Arm, shoulder, or hand pain. Moderate   Tingling (pins and needles) in your arm, shoulder, or hand Moderate   Difficulty Sleeping Mild difficulty   DASH Score 22.73 %                     PT Education - 07/17/14 0955    Education provided Yes   Education Details supine cane exercise   Person(s) Educated Patient   Methods Explanation;Demonstration;Handout   Comprehension Verbalized understanding;Returned demonstration           Short Term Clinic Goals - 07/17/14 1007    CC Short Term Goal  #1   Title short term goals= long term goals             Long Term Clinic Goals - 07/17/14 1007    CC Long Term Goal  #1   Title Pt will report that she has 50% less discomfort in her left arm so that she can perform her duties at work with greater ease   Time 4   Period Weeks   Status New   CC Long Term Goal  #2   Title pt will be independent in a home exercise program for left shoulder strength   Time 4    Period Weeks   Status New   CC Long Term Goal  #3   Title pt will verbalized a plan for continued general strengthening on her own    Time 4   Period Weeks   Status New   CC Long Term Goal  #4   Title pt will verbalize an understanding of postural correction strategies.   Time 4   Period Weeks   Status New            Plan - 07/17/14 0956    Clinical Impression Statement Pt has had multiple procedures during the past year and is now healing well from lattisumus flap for reconstruction to right breast and is ready to increase strength of shoulder and general strengtth    Pt will benefit from skilled therapeutic intervention in order to improve on the following deficits Decreased strength;Pain;Impaired sensation;Decreased activity tolerance;Postural dysfunction;Decreased endurance   Rehab Potential Excellent   Clinical Impairments Affecting Rehab Potential re   PT Frequency 2x / week   PT Duration 4 weeks   PT Treatment/Interventions Therapeutic exercise;Patient/family education;Therapeutic activities   PT Next Visit Plan Meeks spinal decompression exercises, neck and shoulder ROM with postural retraining  to help her at work , Consider dynamometer testing   PT Home Exercise Plan Posutural, shoulder and general strengthening program   Consulted and Agree with Plan of Care Patient         Problem List Patient Active Problem List  Diagnosis Date Noted  . Breast cancer, left breast 05/21/2014  . Breast cancer of lower-outer quadrant of left female breast 05/18/2014  . STRICTURE AND STENOSIS OF ESOPHAGUS 06/28/2009  . BARRETTS ESOPHAGUS 06/28/2009  . ABDOMINAL PAIN, EPIGASTRIC 06/28/2009  . CHEST PAIN UNSPECIFIED 04/28/2009  . GERD 04/21/2009   Donato Heinz. Owens Shark, PT  07/17/2014, 10:11 AM  [place], [place], [address] Phone: [phone]   Fax:  [phone]

## 2014-07-17 NOTE — Patient Instructions (Signed)
Flexion (Eccentric) - Active-Assist (Cane)   Use unaffected arm to push affected arm forward. Avoid hiking shoulder. Keep palm relaxed. Slowly lower affected arm for 3-5 seconds, increasing use of affected arm. __5-10_ reps per set, __1-2_ sets per day, _7__ days per week.  Copyright  VHI. All rights reserved  Cane Exercise: Abduction   Hold cane with right hand over end, palm-up, with other hand palm-down. Move arm out from side and up by pushing with other arm. Hold _1-2___ seconds. Repeat _5-10___ times. Do 1-2___ sessions per day.  http://[email or web address].exer.us/81   Copyright  VHI. All rights reserved.      Cane Exercise: Extension / Internal Rotation   Stand holding cane behind back with both hands palm-up. Slide cane up spine toward head. Hold _1-2___ seconds. Repeat _5-10___ times. Do 1-2____ sessions per day.  http://[email or web address].exer.us/85   Copyright  VHI. All rights reserved.  Kasandra Knudsen Exercise: Extension   Stand holding cane behind back with both hands palm-up. Lift the cane away from body. Hold __1-2__ seconds. Repeat __5-10__ times. Do __1-2 sessions per day.  http://[email or web address].exer.us/83   Copyright  VHI. All rights reserved.

## 2014-07-21 ENCOUNTER — Ambulatory Visit: Payer: BLUE CROSS/BLUE SHIELD

## 2014-07-21 DIAGNOSIS — M6281 Muscle weakness (generalized): Secondary | ICD-10-CM

## 2014-07-21 DIAGNOSIS — R5381 Other malaise: Secondary | ICD-10-CM

## 2014-07-21 DIAGNOSIS — M25512 Pain in left shoulder: Secondary | ICD-10-CM

## 2014-07-21 NOTE — Patient Instructions (Signed)
Posture Tips DO: - stand tall and erect - keep chin tucked in - keep head and shoulders in alignment - check posture regularly in mirror or large window - pull head back against headrest in car seat;  Change your position often.  Sit with lumbar support. DON'T: - slouch or slump while watching TV or reading - sit, stand or lie in one position  for too long;  Sitting is especially hard on the spine so if you sit at a desk/use the computer, then stand up often!   Copyright  VHI. All rights reserved.  Posture - Standing   Good posture is important. Avoid slouching and forward head thrust. Maintain curve in low back and align ears over shoul- ders, hips over ankles.  Pull your belly button in toward your back bone.   Copyright  VHI. All rights reserved.  Posture - Sitting   Sit upright, head facing forward. Try using a roll to support lower back. Keep shoulders relaxed, and avoid rounded back. Keep hips level with knees. Avoid crossing legs for long periods.  Decompression Exercise: Basic   Lie on back on firm surface, knees bent, feet flat, arms turned up, out to sides. Time _5__ minutes. Surface: floor  Then in this position: 1. Head press 2. Shoulder Press 3. Leg Press 4. Leg Lengthener Hold all 5 seconds and do 5-10 reps.  Supine Shoulder Horizontal Abduction With Dumbbell   Holding theraband, keeping elbows straight, open and close arms tapping theraband to chest . Try holding head press throughout. Perform _10__ reps. 1-2 times a day.   Also when laying on bed, keep Rt arm at side, bring Lt arm to Rt, then stretch across body to "opposite corner". 5-10 reps and then same with Lt arm

## 2014-07-21 NOTE — Therapy (Signed)
[place], [place], [address] Phone: [phone]   Fax:  [phone]  Physical Therapy Treatment  Patient Details  Name: Katherine Acosta MRN: [id number] Date of Birth: July 28, 1960 Referring Provider:  Patric Dykes, MD  Encounter Date: 07/21/2014      PT End of Session - 07/21/14 0950    Visit Number 2   Number of Visits 9   Date for PT Re-Evaluation 08/21/14   PT Start Time 6144   PT Stop Time 0943   PT Time Calculation (min) 48 min      Past Medical History  Diagnosis Date  . Hypertension   . Diabetes mellitus without complication     type 2  . Anxiety   . Depression   . Headache     migraines  . Cancer     left breast cancer - march, 2015  . Anemia     low iron  . History of hiatal hernia   . Diarrhea   . PONV (postoperative nausea and vomiting)     also has difficulty waking up    Past Surgical History  Procedure Laterality Date  . Esophagus surgery      had stomach rewrapped around esophagus  . Tonsillectomy    . Cholecystectomy    . Mastectomy    . Breast surgery      breast reduction on right  . Abdominal hysterectomy    . Colonoscopy    . Knee arthroscopy Left   . Mastectomy Left 07/2013  . Breast reconstruction Left 05/21/2014    DR BOWERS  . Latissimus flap to breast Left 05/21/2014    Procedure: LATISSIMUS FLAP TO LEFT BREAST WITH PLACEMENT OF IMPLANT FOR RECONSTRUCTION;  Surgeon: Crissie Reese, MD;  Location: [place];  Service: Plastics;  Laterality: Left;    There were no vitals filed for this visit.  Visit Diagnosis:  Decreased muscle strength  Physical deconditioning  Pain in joint, shoulder region, left      Subjective Assessment - 07/21/14 0858    Symptoms Im not in pain, my Lt shoulder is just uncomfortable. Might have a problem with continuing to come due to work, I'll let you guys know. Going to get a wireless keyboard so I dont have to reach across my  desk so far and can watch my posture better.    Currently in Pain? No/denies                       [facility] Adult PT Treatment/Exercise - 07/21/14 0001    Posture/Postural Control   Posture Comments Instructed pt and answered her wuestions regarding proper posture at work and importance of standing rest breaks and AROM LE seated exercises throughout her work day.   Shoulder Exercises: Supine   Horizontal ABduction AROM;Strengthening;Both;10 reps   Theraband Level (Shoulder Horizontal ABduction) Level 1 (Yellow)   Other Supine Exercises Meeks Decompression Exercises 5 reps and 5 second holds.    Other Supine Exercises D2 pattern with yellow theraband bil UE 10 reps each   Shoulder Exercises: Standing   Extension AROM;Strengthening;Both;10 reps   Theraband Level (Shoulder Extension) Level 1 (Yellow)   Extension Limitations Pt reported discomfort in Lt shoulder towards end of exercises   Retraction AROM;Strengthening;Both;10 reps   Theraband Level (Shoulder Retraction) Level 1 (Yellow)   Manual Therapy   Passive ROM In Supine and all to pts tolerance: Lt shoulder into flexion, abduction and D2. After stretching pt reported  sore but shoulder felt good.                PT Education - 07/21/14 0926    Education provided Yes   Education Details [place] Decompression exercises and Supine Postural strength with yellow theraband, instructed pt regarding proper posture at work and in standing   Person(s) Educated Patient   Methods Explanation;Demonstration;Handout   Comprehension Verbalized understanding;Returned demonstration           Short Term Clinic Goals - 07/17/14 1007    CC Short Term Goal  #1   Title short term goals= long term goals             Long Term Clinic Goals - 07/17/14 1007    CC Long Term Goal  #1   Title Pt will report that she has 50% less discomfort in her left arm so that she can perform her duties at work with greater ease   Time 4    Period Weeks   Status New   CC Long Term Goal  #2   Title pt will be independent in a home exercise program for left shoulder strength   Time 4   Period Weeks   Status New   CC Long Term Goal  #3   Title pt will verbalized a plan for continued general strengthening on her own    Time 4   Period Weeks   Status New   CC Long Term Goal  #4   Title pt will verbalize an understanding of postural correction strategies.   Time 4   Period Weeks   Status New            Plan - 07/21/14 8675    Clinical Impression Statement Pt has already started with HEP issued last visit. Further progressed HEP today to include supine postural strength and postural awareness/education. she seems to have a good understanding of this and had already started to make adjustments to her day(getting a wireless keyboard so as to not have to lean forward so much at her desk).     Pt will benefit from skilled therapeutic intervention in order to improve on the following deficits Decreased strength;Pain;Impaired sensation;Decreased activity tolerance;Postural dysfunction;Decreased endurance   Rehab Potential Excellent   PT Frequency 2x / week   PT Treatment/Interventions Therapeutic exercise;Patient/family education;Therapeutic activities   PT Next Visit Plan Continue neck and shoulder ROM with postural retraining  to help her at work , Consider dynamometer testing   PT Home Exercise Plan Posutural, shoulder and general strengthening program   Consulted and Agree with Plan of Care Patient        Problem List Patient Active Problem List   Diagnosis Date Noted  . Breast cancer, left breast 05/21/2014  . Breast cancer of lower-outer quadrant of left female breast 05/18/2014  . STRICTURE AND STENOSIS OF ESOPHAGUS 06/28/2009  . BARRETTS ESOPHAGUS 06/28/2009  . ABDOMINAL PAIN, EPIGASTRIC 06/28/2009  . CHEST PAIN UNSPECIFIED 04/28/2009  . GERD 04/21/2009    Otelia Limes, PTA 07/21/2014, 10:03  AM  [place], [place], [address] Phone: [phone]   Fax:  [phone]

## 2014-07-23 ENCOUNTER — Encounter: Payer: Self-pay | Admitting: Physical Therapy

## 2014-07-23 ENCOUNTER — Telehealth: Payer: Self-pay

## 2014-07-23 ENCOUNTER — Ambulatory Visit: Payer: BLUE CROSS/BLUE SHIELD | Admitting: Physical Therapy

## 2014-07-23 DIAGNOSIS — M6281 Muscle weakness (generalized): Secondary | ICD-10-CM | POA: Diagnosis not present

## 2014-07-23 DIAGNOSIS — M25612 Stiffness of left shoulder, not elsewhere classified: Secondary | ICD-10-CM

## 2014-07-23 DIAGNOSIS — R5381 Other malaise: Secondary | ICD-10-CM

## 2014-07-23 NOTE — Therapy (Signed)
[place], [place], [address] Phone: [phone]   Fax:  [phone]  Physical Therapy Treatment  Patient Details  Name: Katherine Acosta MRN: [id number] Date of Birth: 1960/12/05 Referring Provider:  Patric Dykes, MD  Encounter Date: 07/23/2014      PT End of Session - 07/23/14 0842    Visit Number 3   Number of Visits 9   Date for PT Re-Evaluation 08/21/14   PT Start Time 0802   PT Stop Time 0843   PT Time Calculation (min) 41 min   Activity Tolerance Patient tolerated treatment well   Behavior During Therapy [facility] for tasks assessed/performed      Past Medical History  Diagnosis Date  . Hypertension   . Diabetes mellitus without complication     type 2  . Anxiety   . Depression   . Headache     migraines  . Cancer     left breast cancer - march, 2015  . Anemia     low iron  . History of hiatal hernia   . Diarrhea   . PONV (postoperative nausea and vomiting)     also has difficulty waking up    Past Surgical History  Procedure Laterality Date  . Esophagus surgery      had stomach rewrapped around esophagus  . Tonsillectomy    . Cholecystectomy    . Mastectomy    . Breast surgery      breast reduction on right  . Abdominal hysterectomy    . Colonoscopy    . Knee arthroscopy Left   . Mastectomy Left 07/2013  . Breast reconstruction Left 05/21/2014    DR BOWERS  . Latissimus flap to breast Left 05/21/2014    Procedure: LATISSIMUS FLAP TO LEFT BREAST WITH PLACEMENT OF IMPLANT FOR RECONSTRUCTION;  Surgeon: Crissie Reese, MD;  Location: [place];  Service: Plastics;  Laterality: Left;    There were no vitals filed for this visit.  Visit Diagnosis:  Decreased muscle strength  Physical deconditioning  Decreased range of motion of left shoulder      Subjective Assessment - 07/23/14 0805    Symptoms I don't have any pain today in my shoulder but yesterday my shoulder muscle felt very  tired after work.  It was a stressful day.   Currently in Pain? No/denies            [facility] PT Assessment - 07/23/14 0001    AROM   Left Shoulder Extension 43 Degrees   Left Shoulder Flexion 142 Degrees   Left Shoulder ABduction 150 Degrees                   [facility] Adult PT Treatment/Exercise - 07/23/14 0001    Shoulder Exercises: Supine   Protraction Left;10 reps  Punches toward ceiling isolated movement to prevent compensa   Shoulder Exercises: Standing   Theraband Level (Shoulder Extension) Level 1 (Yellow)  x10   Row Left;10 reps  No weight; verbal cues for not compensating   Theraband Level (Shoulder Retraction) Level 1 (Yellow)  Verbal cues for posture and technique   Other Standing Exercises Standing back against wall with arms in abduction "W" arms focused on upright posture 2x15 seconds with verbal cues for proper posture and technique   Other Standing Exercises Elbow flexion 2# x10; wall push ups x10 in a small ROM   Shoulder Exercises: Pulleys   Flexion 2 minutes   Flexion Limitations Limited by tightness  ABduction 2 minutes   ABduction Limitations Limited by tightness   Manual Therapy   Passive ROM In Supine and all to pts tolerance: Lt shoulder into flexion, abduction and D2. After stretching pt reported sore but shoulder felt good.  Also passive neural stretching to Lt UE to pt tolerance in supine                PT Education - 07/23/14 0825    Education provided Yes   Education Details Wall push ups   Person(s) Educated Patient   Methods Explanation;Demonstration;Handout   Comprehension Verbalized understanding;Returned demonstration           Short Term Clinic Goals - 07/17/14 1007    CC Short Term Goal  #1   Title short term goals= long term goals             Long Term Clinic Goals - 07/23/14 0846    CC Long Term Goal  #1   Status On-going   CC Long Term Goal  #2   Status On-going   CC Long Term Goal  #3   Status  On-going   CC Long Term Goal  #4   Status On-going            Plan - 07/23/14 0076    Clinical Impression Statement Patient with good ROM today.  She has tightness at end ROM with reports of soreness but tolerated well.  Weakness evident with exericses and muscle appeared shaky.   Pt will benefit from skilled therapeutic intervention in order to improve on the following deficits Decreased strength;Pain;Impaired sensation;Decreased activity tolerance;Postural dysfunction;Decreased endurance   Rehab Potential Excellent   Clinical Impairments Affecting Rehab Potential None   PT Frequency 2x / week   PT Duration 4 weeks   PT Treatment/Interventions Therapeutic exercise;Patient/family education;Therapeutic activities   PT Next Visit Plan Progress strength slowly and cautiously due to difficult healing history and extent of surgeries.   Consulted and Agree with Plan of Care Patient        Problem List Patient Active Problem List   Diagnosis Date Noted  . Breast cancer, left breast 05/21/2014  . Breast cancer of lower-outer quadrant of left female breast 05/18/2014  . STRICTURE AND STENOSIS OF ESOPHAGUS 06/28/2009  . BARRETTS ESOPHAGUS 06/28/2009  . ABDOMINAL PAIN, EPIGASTRIC 06/28/2009  . CHEST PAIN UNSPECIFIED 04/28/2009  . GERD 04/21/2009    Annia Friendly, PT 07/23/2014, 8:47 AM  [place], [place], [address] Phone: [phone]   Fax:  [phone]

## 2014-07-23 NOTE — Telephone Encounter (Signed)
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 Harlow Mares to have the [facility] done along with the MRI.  Pt to call clinic if she has any questions.  Medical records rcvd from Pt from [facility] sent to scan.

## 2014-07-23 NOTE — Patient Instructions (Signed)
Wall Push   Stand at arm's length from wall, feet shoulder width apart. Inhale while gently leaning toward wall. Hold 1 count. Exhale while pushing back to starting position. Repeat __5__ times per set. Do __2__ sets per session. Do __1__ sessions per day.  Only go as far as you are comfortable with without pain! Copyright  VHI. All rights reserved.

## 2014-07-28 ENCOUNTER — Ambulatory Visit: Payer: BLUE CROSS/BLUE SHIELD

## 2014-07-30 ENCOUNTER — Ambulatory Visit: Payer: BLUE CROSS/BLUE SHIELD | Admitting: Physical Therapy

## 2014-07-30 DIAGNOSIS — M25512 Pain in left shoulder: Secondary | ICD-10-CM

## 2014-07-30 DIAGNOSIS — R5381 Other malaise: Secondary | ICD-10-CM

## 2014-07-30 DIAGNOSIS — M6281 Muscle weakness (generalized): Secondary | ICD-10-CM

## 2014-07-30 DIAGNOSIS — M25612 Stiffness of left shoulder, not elsewhere classified: Secondary | ICD-10-CM

## 2014-07-30 NOTE — Therapy (Signed)
[place], [place], [address] Phone: [phone]   Fax:  [phone]  Physical Therapy Treatment  Patient Details  Name: Katherine Acosta MRN: [id number] Date of Birth: 04/18/1961 Referring Provider:  Patric Dykes, MD  Encounter Date: 07/30/2014      PT End of Session - 07/30/14 0848    Visit Number 4   Number of Visits 9   Date for PT Re-Evaluation 08/21/14   PT Start Time 0800   PT Stop Time 9509   PT Time Calculation (min) 44 min      Past Medical History  Diagnosis Date  . Hypertension   . Diabetes mellitus without complication     type 2  . Anxiety   . Depression   . Headache     migraines  . Cancer     left breast cancer - march, 2015  . Anemia     low iron  . History of hiatal hernia   . Diarrhea   . PONV (postoperative nausea and vomiting)     also has difficulty waking up    Past Surgical History  Procedure Laterality Date  . Esophagus surgery      had stomach rewrapped around esophagus  . Tonsillectomy    . Cholecystectomy    . Mastectomy    . Breast surgery      breast reduction on right  . Abdominal hysterectomy    . Colonoscopy    . Knee arthroscopy Left   . Mastectomy Left 07/2013  . Breast reconstruction Left 05/21/2014    DR BOWERS  . Latissimus flap to breast Left 05/21/2014    Procedure: LATISSIMUS FLAP TO LEFT BREAST WITH PLACEMENT OF IMPLANT FOR RECONSTRUCTION;  Surgeon: Crissie Reese, MD;  Location: [facility];  Service: Plastics;  Laterality: Left;    There were no vitals filed for this visit.  Visit Diagnosis:  Decreased muscle strength  Physical deconditioning  Decreased range of motion of left shoulder  Pain in joint, shoulder region, left      Subjective Assessment - 07/30/14 0803    Symptoms "It loosened me up a bit, but I feel a pinch in my neck on the right side   Patient Stated Goals I hope to get rid of the tiredness in left arm and build the  strength up,    Currently in Pain? No/denies  it just loosening up some                        [facility] Adult PT Treatment/Exercise - 07/30/14 0001    Lumbar Exercises: Supine   Ab Set 10 reps  pelvic tilts   Other Supine Lumbar Exercises lower trunk rotation   Other Supine Lumbar Exercises meeks decompression  exercise    Lumbar Exercises: Sidelying   Clam 5 reps  on each side with core activation    Shoulder Exercises: Supine   Protraction Left;10 reps;Theraband  Punches toward ceiling with assist for theraband stabilaizai   Theraband Level (Shoulder Protraction) Level 2 (Red)   External Rotation Strengthening;10 reps;Theraband;Both   Theraband Level (Shoulder External Rotation) Level 2 (Red)   Flexion Strengthening;Both;10 reps;Theraband  narrow and wide grip   Theraband Level (Shoulder Flexion) Level 2 (Red)   Other Supine Exercises D2 pattern with red theraband bil UE 10 reps each   Shoulder Exercises: Standing   Theraband Level (Shoulder Extension) Level 1 (Yellow)  x10   Row Left;10 reps  No  weight; verbal cues for not compensating   Theraband Level (Shoulder Retraction) Level 1 (Yellow)  Verbal cues for posture and technique   Other Standing Exercises Standing back against wall with arms in abduction "W" arms focused on upright posture 2x15 seconds with verbal cues for proper posture and technique   Other Standing Exercises Elbow flexion 2# x10; wall push ups x10 in a small ROM   Shoulder Exercises: Pulleys   Flexion 2 minutes   Flexion Limitations Limited by tightness   ABduction 2 minutes   ABduction Limitations Limited by tightness   Manual Therapy   Passive ROM In Supine and all to pts tolerance: Lt shoulder into flexion, abduction and diagonals    Neck Exercises: Stretches   Other Neck Stretches neck and scapular range of motion                PT Education - 07/30/14 0813    Education provided Yes   Education Details Meeks decompression  exercies    Person(s) Educated Patient   Methods Explanation;Demonstration;Handout   Comprehension Verbalized understanding;Returned demonstration           Short Term Clinic Goals - 07/17/14 1007    CC Short Term Goal  #1   Title short term goals= long term goals             Long Term Clinic Goals - 07/30/14 0840    CC Long Term Goal  #1   Title Pt will report that she has 50% less discomfort in her left arm so that she can perform her duties at work with greater ease  not quite aqt 50%, discomfort related to deskwork   Status On-going   CC Long Term Goal  #2   Title pt will be independent in a home exercise program for left shoulder strength   Status On-going   CC Long Term Goal  #3   Title pt will verbalized a plan for continued general strengthening on her own   wants to do yoga    Status On-going   CC Long Term Goal  #4   Title pt will verbalize an understanding of postural correction strategies.  making progess    Status On-going            Plan - 07/30/14 4193    Clinical Impression Statement Pt has good range of motion porgress .  Able to increase to red theraband today. Good form with all exercise.   PT Next Visit Plan address strategies for posture correction at work, continue with range of  motion and strengthening        Problem List Patient Active Problem List   Diagnosis Date Noted  . Breast cancer, left breast 05/21/2014  . Breast cancer of lower-outer quadrant of left female breast 05/18/2014  . STRICTURE AND STENOSIS OF ESOPHAGUS 06/28/2009  . BARRETTS ESOPHAGUS 06/28/2009  . ABDOMINAL PAIN, EPIGASTRIC 06/28/2009  . CHEST PAIN UNSPECIFIED 04/28/2009  . GERD 04/21/2009   Donato Heinz. Owens Shark, PT  07/30/2014, 8:49 AM  [place], [place], [address] Phone: [phone]   Fax:  [phone]

## 2014-08-06 ENCOUNTER — Ambulatory Visit: Payer: BLUE CROSS/BLUE SHIELD | Attending: Plastic Surgery

## 2014-08-06 DIAGNOSIS — R5381 Other malaise: Secondary | ICD-10-CM | POA: Diagnosis not present

## 2014-08-06 DIAGNOSIS — C50512 Malignant neoplasm of lower-outer quadrant of left female breast: Secondary | ICD-10-CM | POA: Insufficient documentation

## 2014-08-06 DIAGNOSIS — M25512 Pain in left shoulder: Secondary | ICD-10-CM | POA: Insufficient documentation

## 2014-08-06 DIAGNOSIS — M6281 Muscle weakness (generalized): Secondary | ICD-10-CM | POA: Diagnosis present

## 2014-08-06 DIAGNOSIS — M25612 Stiffness of left shoulder, not elsewhere classified: Secondary | ICD-10-CM

## 2014-08-06 NOTE — Therapy (Signed)
[place], [place], [address] Phone: [phone]   Fax:  [phone]  Physical Therapy Treatment  Patient Details  Name: Katherine Acosta MRN: [id number] Date of Birth: [birth year] Referring Provider:  Patric Dykes, MD  Encounter Date: 08/06/2014      PT End of Session - 08/06/14 0827    Visit Number 5   Number of Visits 9   Date for PT Re-Evaluation 08/21/14   PT Start Time 0800   PT Stop Time 0845   PT Time Calculation (min) 45 min      Past Medical History  Diagnosis Date  . Hypertension   . Diabetes mellitus without complication     type 2  . Anxiety   . Depression   . Headache     migraines  . Cancer     left breast cancer - march, 2015  . Anemia     low iron  . History of hiatal hernia   . Diarrhea   . PONV (postoperative nausea and vomiting)     also has difficulty waking up    Past Surgical History  Procedure Laterality Date  . Esophagus surgery      had stomach rewrapped around esophagus  . Tonsillectomy    . Cholecystectomy    . Mastectomy    . Breast surgery      breast reduction on right  . Abdominal hysterectomy    . Colonoscopy    . Knee arthroscopy Left   . Mastectomy Left 07/2013  . Breast reconstruction Left 05/21/2014    DR BOWERS  . Latissimus flap to breast Left 05/21/2014    Procedure: LATISSIMUS FLAP TO LEFT BREAST WITH PLACEMENT OF IMPLANT FOR RECONSTRUCTION;  Surgeon: Crissie Reese, MD;  Location: [facility];  Service: Plastics;  Laterality: Left;    There were no vitals filed for this visit.  Visit Diagnosis:  Decreased muscle strength  Physical deconditioning  Decreased range of motion of left shoulder  Pain in joint, shoulder region, left      Subjective Assessment - 08/06/14 0804    Subjective My upper Lt arm feels heavy today. Not sure if Im doing the exercises right.               LYMPHEDEMA/ONCOLOGY QUESTIONNAIRE - 08/06/14 0818    Left Upper Extremity Lymphedema   10 cm Proximal to Olecranon Process 34.5 cm   Olecranon Process 26.8 cm   15 cm Proximal to Ulnar Styloid Process 26.5 cm   Just Proximal to Ulnar Styloid Process 16.1 cm   Across Hand at [employer] 17.9 cm   At [place] of 2nd Digit 6 cm                [facility] Adult PT Treatment/Exercise - 08/06/14 0001    Shoulder Exercises: Supine   Horizontal ABduction AROM;Strengthening;Both;10 reps  Supine on pink foam roll   Theraband Level (Shoulder Horizontal ABduction) Level 2 (Red)   External Rotation Strengthening;10 reps;Theraband;Both  Supine on pink foam roll   Theraband Level (Shoulder External Rotation) Level 2 (Red)   Other Supine Exercises D2 pattern while supine on pink foam roll with red theraband 10 reps with Rt UE, 4 with Lt UE but stopped due to pain today with this.   Shoulder Exercises: Pulleys   Flexion 2 minutes   ABduction 2 minutes   Manual Therapy   Passive ROM In Supine and all to pts tolerance: Lt shoulder into flexion, abduction  and D2 pattern.                    Short Term Clinic Goals - 07/17/14 1007    CC Short Term Goal  #1   Title short term goals= long term goals             Long Term Clinic Goals - 08/06/14 [phone]    CC Long Term Goal  #1   Title Pt will report that she has 50% less discomfort in her left arm so that she can perform her duties at work with greater ease  PT reports 60% improvement   Status Achieved   CC Long Term Goal  #2   Title pt will be independent in a home exercise program for left shoulder strength   Status On-going   CC Long Term Goal  #3   Title pt will verbalized a plan for continued general strengthening on her own    Status On-going   CC Long Term Goal  #4   Title pt will verbalize an understanding of postural correction strategies.   Status Achieved            Plan - 08/06/14 1478    Clinical Impression Statement Pts circumference measurements have  decreased, so heaviness reported in arm isnt from lymphedema. Did less active exercises today to focus on stretching to allow pts Lt shoulder to rest.   Pt will benefit from skilled therapeutic intervention in order to improve on the following deficits Decreased strength;Pain;Impaired sensation;Decreased activity tolerance;Postural dysfunction;Decreased endurance   Rehab Potential Excellent   Clinical Impairments Affecting Rehab Potential None   PT Frequency 2x / week   PT Duration 4 weeks   PT Treatment/Interventions Therapeutic exercise;Patient/family education;Therapeutic activities   PT Next Visit Plan address strategies for posture correction at work, continue with range of  motion and strengthening   Consulted and Agree with Plan of Care Patient        Problem List Patient Active Problem List   Diagnosis Date Noted  . Breast cancer, left breast 05/21/2014  . Breast cancer of lower-outer quadrant of left female breast 05/18/2014  . STRICTURE AND STENOSIS OF ESOPHAGUS 06/28/2009  . BARRETTS ESOPHAGUS 06/28/2009  . ABDOMINAL PAIN, EPIGASTRIC 06/28/2009  . CHEST PAIN UNSPECIFIED 04/28/2009  . GERD 04/21/2009    Otelia Limes, PTA 08/06/2014, 8:47 AM  [place], [place], [address] Phone: [phone]   Fax:  [phone]

## 2014-08-11 ENCOUNTER — Ambulatory Visit: Payer: BLUE CROSS/BLUE SHIELD

## 2014-08-11 DIAGNOSIS — M6281 Muscle weakness (generalized): Secondary | ICD-10-CM

## 2014-08-11 DIAGNOSIS — M25512 Pain in left shoulder: Secondary | ICD-10-CM

## 2014-08-11 DIAGNOSIS — R5381 Other malaise: Secondary | ICD-10-CM

## 2014-08-11 DIAGNOSIS — M25612 Stiffness of left shoulder, not elsewhere classified: Secondary | ICD-10-CM

## 2014-08-11 NOTE — Patient Instructions (Addendum)
Shoulder Forward Flexion to 90   3 WAY RAISES: With arms at sides, thumbs forward, lift both arms forward to chest level holding 1 lb to start. (Can increase weights as tolerable) Repeat 10____ times per session. Do __1-2__ sessions per day. Position: Standing  Also lift arms wider in a "V", and then out to side in a "T"  All 10 reps 1-2 times a day.   Copyright  VHI. All rights reserved.  Resistance: PNF D2 Flexion   Opposite side toward anchor in shoulder width stance. Right palm back, pull across body and up. Rotate hand and arm to end with thumb pointing back (hitchhike). Repeat _8-10__ times. Repeat with other arm for set. Do _1-2__ sets per session. Do without resistance first until muscle fatigue improves.  http://plyo.exer.us/220   Copyright  VHI. All rights reserved.  Can also do wall push ups 10 reps, 1 time/day.  When arm/shoulder is fatigued, stop/rest!

## 2014-08-11 NOTE — Therapy (Signed)
[place], [place], [address] Phone: [phone]   Fax:  [phone]  Physical Therapy Treatment  Patient Details  Name: Katherine Acosta MRN: [id number] Date of Birth: Jun 09, 1960 Referring Provider:  Patric Dykes, MD  Encounter Date: 08/11/2014      PT End of Session - 08/11/14 0947    Visit Number 6   Number of Visits 9   Date for PT Re-Evaluation 08/21/14   PT Start Time 0851   PT Stop Time 0933   PT Time Calculation (min) 42 min      Past Medical History  Diagnosis Date  . Hypertension   . Diabetes mellitus without complication     type 2  . Anxiety   . Depression   . Headache     migraines  . Cancer     left breast cancer - march, 2015  . Anemia     low iron  . History of hiatal hernia   . Diarrhea   . PONV (postoperative nausea and vomiting)     also has difficulty waking up    Past Surgical History  Procedure Laterality Date  . Esophagus surgery      had stomach rewrapped around esophagus  . Tonsillectomy    . Cholecystectomy    . Mastectomy    . Breast surgery      breast reduction on right  . Abdominal hysterectomy    . Colonoscopy    . Knee arthroscopy Left   . Mastectomy Left 07/2013  . Breast reconstruction Left 05/21/2014    DR BOWERS  . Latissimus flap to breast Left 05/21/2014    Procedure: LATISSIMUS FLAP TO LEFT BREAST WITH PLACEMENT OF IMPLANT FOR RECONSTRUCTION;  Surgeon: Crissie Reese, MD;  Location: [facility];  Service: Plastics;  Laterality: Left;    There were no vitals filed for this visit.  Visit Diagnosis:  Decreased muscle strength  Physical deconditioning  Decreased range of motion of left shoulder  Pain in joint, shoulder region, left      Subjective Assessment - 08/11/14 0853    Subjective My Lt arm just feels tired today, no pain. My Lt upper arm was kind of quivering yesterday, it just feels so weak.    Currently in Pain? No/denies                        [facility] Adult PT Treatment/Exercise - 08/11/14 0001    Shoulder Exercises: Pulleys   Flexion 2 minutes   ABduction 2 minutes   Shoulder Exercises: Therapy Ball   Flexion 10 reps   Flexion Limitations Shoulder fatigue reported after   Shoulder Exercises: ROM/Strengthening   Wall Pushups 10 reps   Other ROM/Strengthening Exercises Standing 3 way raises with bil UE's, 1 lb each hand into flexion, scaption, and abduction to 90 degrees each 10 reps each.    Other ROM/Strengthening Exercises In standing: Red Theraband for horizontal abduction 10 reps, 5 reps of Lt D2 with red band but increased fatigue, so 8 more reps without resistance bil UE's.    Manual Therapy   Passive ROM In Supine and all to pts tolerance: Lt shoulder into flexion, abduction and D2 pattern.                 PT Education - 08/11/14 0908    Education provided Yes   Education Details Shoulder strength   Person(s) Educated Patient   Methods Explanation;Demonstration;Handout   Comprehension  Verbalized understanding;Returned demonstration;Verbal cues required           Short Term Clinic Goals - 07/17/14 1007    CC Short Term Goal  #1   Title short term goals= long term goals             Long Term Clinic Goals - 08/06/14 0826    CC Long Term Goal  #1   Title Pt will report that she has 50% less discomfort in her left arm so that she can perform her duties at work with greater ease  PT reports 60% improvement   Status Achieved   CC Long Term Goal  #2   Title pt will be independent in a home exercise program for left shoulder strength   Status On-going   CC Long Term Goal  #3   Title pt will verbalized a plan for continued general strengthening on her own    Status On-going   CC Long Term Goal  #4   Title pt will verbalize an understanding of postural correction strategies.   Status Achieved            Plan - 08/11/14 0947    Clinical Impression Statement  Pt reports ready to discharge next visit. Increased her HEP today and though fatigued, pt tolerated well and knows to decrease activity when fatigue increases.    Pt will benefit from skilled therapeutic intervention in order to improve on the following deficits Decreased strength;Pain;Impaired sensation;Decreased activity tolerance;Postural dysfunction;Decreased endurance   Rehab Potential Excellent   Clinical Impairments Affecting Rehab Potential None   PT Frequency 2x / week   PT Duration 4 weeks   PT Treatment/Interventions Therapeutic exercise;Patient/family education;Therapeutic activities   PT Next Visit Plan Discharge next visit. Review all goals and progress HEP prn.   Consulted and Agree with Plan of Care Patient        Problem List Patient Active Problem List   Diagnosis Date Noted  . Breast cancer, left breast 05/21/2014  . Breast cancer of lower-outer quadrant of left female breast 05/18/2014  . STRICTURE AND STENOSIS OF ESOPHAGUS 06/28/2009  . BARRETTS ESOPHAGUS 06/28/2009  . ABDOMINAL PAIN, EPIGASTRIC 06/28/2009  . CHEST PAIN UNSPECIFIED 04/28/2009  . GERD 04/21/2009    Otelia Limes, PTA 08/11/2014, 9:54 AM  [place], [place], [address] Phone: [phone]   Fax:  [phone]

## 2014-08-13 ENCOUNTER — Ambulatory Visit: Payer: BLUE CROSS/BLUE SHIELD

## 2014-08-13 DIAGNOSIS — M6281 Muscle weakness (generalized): Secondary | ICD-10-CM

## 2014-08-13 DIAGNOSIS — R5381 Other malaise: Secondary | ICD-10-CM

## 2014-08-13 DIAGNOSIS — M25512 Pain in left shoulder: Secondary | ICD-10-CM

## 2014-08-13 DIAGNOSIS — M25612 Stiffness of left shoulder, not elsewhere classified: Secondary | ICD-10-CM

## 2014-08-13 NOTE — Therapy (Signed)
[place], [place], [address] Phone: [phone]   Fax:  [phone]  Physical Therapy Treatment  Patient Details  Name: Katherine Acosta MRN: [id number] Date of Birth: Jan 26, 1961 Referring Provider:  Crissie Reese, MD  Encounter Date: 08/13/2014      PT End of Session - 08/13/14 0846    Visit Number 7   Number of Visits 9   Date for PT Re-Evaluation 08/21/14   PT Start Time 0802   PT Stop Time 0846   PT Time Calculation (min) 44 min      Past Medical History  Diagnosis Date  . Hypertension   . Diabetes mellitus without complication     type 2  . Anxiety   . Depression   . Headache     migraines  . Cancer     left breast cancer - march, 2015  . Anemia     low iron  . History of hiatal hernia   . Diarrhea   . PONV (postoperative nausea and vomiting)     also has difficulty waking up    Past Surgical History  Procedure Laterality Date  . Esophagus surgery      had stomach rewrapped around esophagus  . Tonsillectomy    . Cholecystectomy    . Mastectomy    . Breast surgery      breast reduction on right  . Abdominal hysterectomy    . Colonoscopy    . Knee arthroscopy Left   . Mastectomy Left 07/2013  . Breast reconstruction Left 05/21/2014    DR BOWERS  . Latissimus flap to breast Left 05/21/2014    Procedure: LATISSIMUS FLAP TO LEFT BREAST WITH PLACEMENT OF IMPLANT FOR RECONSTRUCTION;  Surgeon: Crissie Reese, MD;  Location: [facility];  Service: Plastics;  Laterality: Left;    There were no vitals filed for this visit.  Visit Diagnosis:  Decreased muscle strength  Physical deconditioning  Decreased range of motion of left shoulder  Pain in joint, shoulder region, left      Subjective Assessment - 08/13/14 0810    Subjective I've been doing my exercises at home and they are really helping and the Meeks exercises have made my legs feel better at work. Start [facility] end of month and  I've been talking to a counselor at the cancer center and that has helped me alot too.   Currently in Pain? No/denies                       [facility] Adult PT Treatment/Exercise - 08/13/14 0001    Manual Therapy   Passive ROM In Supine and all to pts tolerance: Lt shoulder into flexion, abduction and D2 pattern.         And myofascial UE pulling throughout PROM and scapular mobilizations focusing on depression and retraction.           Short Term Clinic Goals - 07/17/14 1007    CC Short Term Goal  #1   Title short term goals= long term goals             Long Term Clinic Goals - 08/13/14 0809    CC Long Term Goal  #2   Title pt will be independent in a home exercise program for left shoulder strength   Status Achieved   CC Long Term Goal  #3   Title pt will verbalized a plan for continued general strengthening on her own  Pt wants to begin a yoga class and will start [facility] end of this month.   Status Achieved            Plan - 08/13/14 0807    Clinical Impression Statement Pt has made great improvements with awareness of posture throughout workout day and how to adjust things (i.e. chair) when she notices she isnt ergonomically correct. She is independent with HEP as well. Pt appears ready for discharge.   Pt will benefit from skilled therapeutic intervention in order to improve on the following deficits Decreased strength;Pain;Impaired sensation;Decreased activity tolerance;Postural dysfunction;Decreased endurance   Rehab Potential Excellent   Clinical Impairments Affecting Rehab Potential None   PT Frequency 2x / week   PT Duration 4 weeks   PT Treatment/Interventions Therapeutic exercise;Patient/family education;Therapeutic activities   PT Next Visit Plan Discharge visit.   Consulted and Agree with Plan of Care Patient        Problem List Patient Active Problem List   Diagnosis Date Noted  . Breast cancer, left breast 05/21/2014  . Breast  cancer of lower-outer quadrant of left female breast 05/18/2014  . STRICTURE AND STENOSIS OF ESOPHAGUS 06/28/2009  . BARRETTS ESOPHAGUS 06/28/2009  . ABDOMINAL PAIN, EPIGASTRIC 06/28/2009  . CHEST PAIN UNSPECIFIED 04/28/2009  . GERD 04/21/2009    Otelia Limes, PTA 08/13/2014, 8:47 AM   PHYSICAL THERAPY DISCHARGE SUMMARY  Visits from Start of Care: 7  Current functional level related to goals / functional outcomes:  independent in home exercise program and improvement at performing duties at work and home Remaining deficits: Occasional tightness in shoulder    Education / Equipment: Home exercise program, work modification strategies   Plan: Patient agrees to discharge.  Patient goals were met. Patient is being discharged due to meeting the stated rehab goals.  ?????       Donato Heinz. Owens Shark, [place], [place], [address] Phone: [phone]   Fax:  [phone]

## 2014-10-15 ENCOUNTER — Telehealth: Payer: Self-pay

## 2014-10-15 NOTE — Telephone Encounter (Signed)
Pt called asking about her calcium shot (prolia), her mammogram and MRI.

## 2014-10-16 ENCOUNTER — Other Ambulatory Visit: Payer: Self-pay

## 2014-10-19 ENCOUNTER — Telehealth: Payer: Self-pay | Admitting: Hematology and Oncology

## 2014-10-19 ENCOUNTER — Other Ambulatory Visit: Payer: Self-pay

## 2014-10-19 NOTE — Telephone Encounter (Signed)
Called patient and left a message to reschedule her cancelled mammo and gave number for mri.advised to call with dates so we can schedule  Her dr Lindi Adie follow up,injection scheduled  anne

## 2014-10-20 ENCOUNTER — Telehealth: Payer: Self-pay | Admitting: *Deleted

## 2014-10-20 ENCOUNTER — Other Ambulatory Visit: Payer: Self-pay | Admitting: *Deleted

## 2014-10-20 NOTE — Telephone Encounter (Signed)
TC from patient regarding scheduling questions. Transferred to [employer].

## 2014-11-04 ENCOUNTER — Ambulatory Visit
Admission: RE | Admit: 2014-11-04 | Discharge: 2014-11-04 | Disposition: A | Discharge status: Home / Self Care | Payer: BLUE CROSS/BLUE SHIELD | Source: Ambulatory Visit | Attending: Hematology and Oncology | Admitting: Hematology and Oncology

## 2014-11-04 ENCOUNTER — Other Ambulatory Visit: Payer: Self-pay | Admitting: Hematology and Oncology

## 2014-11-04 DIAGNOSIS — Z1231 Encounter for screening mammogram for malignant neoplasm of breast: Secondary | ICD-10-CM

## 2014-11-04 DIAGNOSIS — C50512 Malignant neoplasm of lower-outer quadrant of left female breast: Secondary | ICD-10-CM

## 2014-11-13 ENCOUNTER — Ambulatory Visit
Admission: RE | Admit: 2014-11-13 | Discharge: 2014-11-13 | Disposition: A | Discharge status: Home / Self Care | Payer: BLUE CROSS/BLUE SHIELD | Source: Ambulatory Visit | Attending: Hematology and Oncology | Admitting: Hematology and Oncology

## 2014-11-13 DIAGNOSIS — C50512 Malignant neoplasm of lower-outer quadrant of left female breast: Secondary | ICD-10-CM

## 2014-11-13 MED ORDER — GADOBENATE DIMEGLUMINE 529 MG/ML IV SOLN
17.0000 mL | Freq: Once | INTRAVENOUS | Status: AC | PRN
  Administered 2014-11-13: 17 mL via INTRAVENOUS

## 2014-11-19 ENCOUNTER — Telehealth: Payer: Self-pay | Admitting: Hematology and Oncology

## 2014-11-19 ENCOUNTER — Encounter: Payer: Self-pay | Admitting: Hematology and Oncology

## 2014-11-19 ENCOUNTER — Ambulatory Visit (HOSPITAL_BASED_OUTPATIENT_CLINIC_OR_DEPARTMENT_OTHER): Payer: BLUE CROSS/BLUE SHIELD | Admitting: Hematology and Oncology

## 2014-11-19 VITALS — BP 119/75 | HR 91 | Temp 98.2°F | Resp 18 | Ht 62.0 in | Wt 186.8 lb

## 2014-11-19 DIAGNOSIS — C50512 Malignant neoplasm of lower-outer quadrant of left female breast: Secondary | ICD-10-CM | POA: Diagnosis not present

## 2014-11-19 NOTE — Telephone Encounter (Signed)
Gave avs& calendar for Jana[place] 2017

## 2014-11-19 NOTE — Progress Notes (Signed)
Patient Care Team: Patric Dykes, MD as PCP - General (Family Medicine)  DIAGNOSIS: Breast cancer of lower-outer quadrant of left female breast   Staging form: Breast, AJCC 7th Edition     Pathologic: Stage IA (T1c, N0(i+), cM0) - Signed by Rulon Eisenmenger, MD on 05/18/2014   SUMMARY OF ONCOLOGIC HISTORY:   Breast cancer of lower-outer quadrant of left female breast   07/16/2013 Procedure No BRCA1 or 2 PA LB2 mutation   08/14/2013 Surgery Left breast mastectomy: Multifocal invasive ductal carcinoma 2 cm, 2 cm, 4 mm, focal DCIS, focal LCIS, margins negative, 1 node had micrometastatic disease less than 200 cells, Oncotype DX score is 15 and 6, well-differentiated, followed by reconstruction   09/15/2013 -  Anti-estrogen oral therapy Letrozole 2.5 mg by mouth daily   03/31/2014 Surgery Reduction mammoplasty on the right breast revealed atypical lobular hyperplasia    CHIEF COMPLIANT: Follow-up after recent mammogram and MRI, currently on letrozole  INTERVAL HISTORY: Katherine Acosta is a 54 year old with above-mentioned history of left breast cancer diagnosed in April 2015 she had multifocal stage IA breast cancer she had low risk Oncotype DX score and did not need chemotherapy. She was started on antiestrogen therapy 09/15/2013. She is tolerating it fairly well except for hot flashes and myalgias. She had a reduction mammoplasty on the right breast in 03/31/2014 which are atypical lobular hyperplasia. I did not recommend any further surgeries. She continues to be on antiestrogen therapy. Recently she had a mammogram and MRI of the breast which were normal. She is here today to discuss the results of these tests.  REVIEW OF SYSTEMS:   Constitutional: Denies fevers, chills or abnormal weight loss Eyes: Denies blurriness of vision Ears, nose, mouth, throat, and face: Denies mucositis or sore throat Respiratory: Denies cough, dyspnea or wheezes Cardiovascular: Denies palpitation, chest discomfort or lower  extremity swelling Gastrointestinal:  Denies nausea, heartburn or change in bowel habits Skin: Denies abnormal skin rashes Lymphatics: Denies new lymphadenopathy or easy bruising Neurological:Denies numbness, tingling or new weaknesses Behavioral/Psych: Mood is stable, no new changes  Breast:  denies any pain or lumps or nodules in either breasts All other systems were reviewed with the patient and are negative.  I have reviewed the past medical history, past surgical history, social history and family history with the patient and they are unchanged from previous note.  ALLERGIES:  is allergic to tape.  MEDICATIONS:  Current Outpatient Prescriptions  Medication Sig Dispense Refill  . benazepril (LOTENSIN) 20 MG tablet Take 20 mg by mouth daily.     Marland Kitchen doxycycline (VIBRA-TABS) 100 MG tablet Take 1 tablet (100 mg total) by mouth every 12 (twelve) hours. (Patient not taking: Reported on 07/17/2014) 30 tablet 0  . enoxaparin (LOVENOX) 40 MG/0.4ML injection Inject 0.4 mLs (40 mg total) into the skin daily. (Patient not taking: Reported on 07/17/2014) 12 Syringe 0  . HYDROmorphone (DILAUDID) 2 MG tablet Take 1-2 tablets (2-4 mg total) by mouth every 4 (four) hours as needed for moderate pain. (Patient not taking: Reported on 07/17/2014) 40 tablet 0  . letrozole (FEMARA) 2.5 MG tablet Take 2.5 mg by mouth daily.    . metFORMIN (GLUCOPHAGE) 500 MG tablet Take 500 mg by mouth daily with breakfast.    . methocarbamol (ROBAXIN) 500 MG tablet Take 1 tablet (500 mg total) by mouth 4 (four) times daily. 40 tablet 0  . pantoprazole (PROTONIX) 40 MG tablet Take 40 mg by mouth daily.     . promethazine (PHENERGAN)  12.5 MG tablet Take 1 tablet (12.5 mg total) by mouth every 6 (six) hours as needed for nausea or vomiting. 30 tablet 0  . venlafaxine XR (EFFEXOR-XR) 150 MG 24 hr capsule Take 150 mg by mouth daily with breakfast.     No current facility-administered medications for this visit.    PHYSICAL  EXAMINATION: ECOG PERFORMANCE STATUS: 0 - Asymptomatic  Filed Vitals:   11/19/14 1440  BP: 119/75  Pulse: 91  Temp: 98.2 F (36.8 C)  Resp: 18   Filed Weights   11/19/14 1440  Weight: 186 lb 12.8 oz (84.732 kg)    GENERAL:alert, no distress and comfortable SKIN: skin color, texture, turgor are normal, no rashes or significant lesions EYES: normal, Conjunctiva are pink and non-injected, sclera clear OROPHARYNX:no exudate, no erythema and lips, buccal mucosa, and tongue normal  NECK: supple, thyroid normal size, non-tender, without nodularity LYMPH:  no palpable lymphadenopathy in the cervical, axillary or inguinal LUNGS: clear to auscultation and percussion with normal breathing effort HEART: regular rate & rhythm and no murmurs and no lower extremity edema ABDOMEN:abdomen soft, non-tender and normal bowel sounds Musculoskeletal:no cyanosis of digits and no clubbing  NEURO: alert & oriented x 3 with fluent speech, no focal motor/sensory deficits BREAST: No palpable masses or nodules in either right breast. Left breast reconstructed no nodularity around the periphery R axilla. No palpable axillary supraclavicular or infraclavicular adenopathy no breast tenderness or nipple discharge. (exam performed in the presence of a chaperone)  LABORATORY DATA:  I have reviewed the data as listed   Chemistry      Component Value Date/Time   NA 140 05/21/2014 1149   K 3.8 05/21/2014 1149   CL 105 05/21/2014 1149   CO2 23 05/21/2014 1149   BUN 6 05/21/2014 1149   CREATININE 0.63 05/21/2014 1149      Component Value Date/Time   CALCIUM 9.4 05/21/2014 1149       Lab Results  Component Value Date   WBC 5.4 05/21/2014   HGB 13.0 05/21/2014   HCT 36.6 05/21/2014   MCV 87.8 05/21/2014   PLT 236 05/21/2014     RADIOGRAPHIC STUDIES: I have personally reviewed the radiology reports and agreed with their findings. Mammogram and MRIs are reviewed as below  ASSESSMENT & PLAN:  Breast  cancer of lower-outer quadrant of left female breast Left breast invasive ductal carcinoma multifocal disease, 2 cm, 2 cm, 4 mm, T1 cN0 M0 stage IA 3 SLN negative but it showed IHC positive for micrometastatic disease less than 200 cells accompanied by extensive DCIS grade 2 margins negative, no lymphovascular invasion or dermal invasion, Oncotype DX recurrence score 15 (9% ROR) and 6 (5% ROR) on each of the tumors with third tumor could not be done. Current treatment: Letrozole started May 2015  Right breast atypical lobular hyperplasia detected during reduction mammoplasty surgery Mammogram 11/05/2014: Negative MRI 11/13/2014: No MRI evidence of malignancy Our plan is to get annual mammograms in the future. I do not believe there is any need for annual MRIs. Return to clinic in 1 year for follow-up   No orders of the defined types were placed in this encounter.   The patient has a good understanding of the overall plan. she agrees with it. she will call with any problems that may develop before the next visit here.   Rulon Eisenmenger, MD

## 2014-11-19 NOTE — Assessment & Plan Note (Signed)
Left breast invasive ductal carcinoma multifocal disease, 2 cm, 2 cm, 4 mm, T1 cN0 M0 stage IA 3 SLN negative but it showed IHC positive for micrometastatic disease less than 200 cells accompanied by extensive DCIS grade 2 margins negative, no lymphovascular invasion or dermal invasion, Oncotype DX recurrence score 15 (9% ROR) and 6 (5% ROR) on each of the tumors with third tumor could not be done. Current treatment: Letrozole started May 2015  Right breast atypical lobular hyperplasia detected during reduction mammoplasty surgery Mammogram 11/05/2014: Negative MRI 11/13/2014: No MRI evidence of malignancy  Return to clinic in 1 year for follow-up

## 2014-12-09 ENCOUNTER — Other Ambulatory Visit: Payer: Self-pay | Admitting: Hematology and Oncology

## 2014-12-09 DIAGNOSIS — C50512 Malignant neoplasm of lower-outer quadrant of left female breast: Secondary | ICD-10-CM

## 2014-12-10 ENCOUNTER — Ambulatory Visit (HOSPITAL_BASED_OUTPATIENT_CLINIC_OR_DEPARTMENT_OTHER): Payer: BLUE CROSS/BLUE SHIELD

## 2014-12-10 ENCOUNTER — Other Ambulatory Visit (HOSPITAL_BASED_OUTPATIENT_CLINIC_OR_DEPARTMENT_OTHER): Payer: BLUE CROSS/BLUE SHIELD

## 2014-12-10 VITALS — BP 112/72 | HR 85 | Temp 98.2°F

## 2014-12-10 DIAGNOSIS — C50512 Malignant neoplasm of lower-outer quadrant of left female breast: Secondary | ICD-10-CM | POA: Diagnosis not present

## 2014-12-10 DIAGNOSIS — M8580 Other specified disorders of bone density and structure, unspecified site: Secondary | ICD-10-CM | POA: Diagnosis not present

## 2014-12-10 LAB — COMPREHENSIVE METABOLIC PANEL (CC13)
ALT: 61 U/L — AB (ref 0–55)
ANION GAP: 10 meq/L (ref 3–11)
AST: 46 U/L — AB (ref 5–34)
Albumin: 4.2 g/dL (ref 3.5–5.0)
Alkaline Phosphatase: 71 U/L (ref 40–150)
BILIRUBIN TOTAL: 0.47 mg/dL (ref 0.20–1.20)
BUN: 8.9 mg/dL (ref 7.0–26.0)
CO2: 26 mEq/L (ref 22–29)
CREATININE: 0.8 mg/dL (ref 0.6–1.1)
Calcium: 9.3 mg/dL (ref 8.4–10.4)
Chloride: 106 mEq/L (ref 98–109)
EGFR: 81 mL/min/{1.73_m2} — AB (ref >90)
Glucose: 118 mg/dl (ref 70–140)
Potassium: 4.6 mEq/L (ref 3.5–5.1)
Sodium: 142 mEq/L (ref 136–145)
Total Protein: 7.1 g/dL (ref 6.4–8.3)

## 2014-12-10 MED ORDER — DENOSUMAB 60 MG/ML ~~LOC~~ SOLN
60.0000 mg | Freq: Once | SUBCUTANEOUS | Status: AC
  Administered 2014-12-10: 60 mg via SUBCUTANEOUS
  Filled 2014-12-10: qty 1

## 2014-12-14 ENCOUNTER — Encounter: Payer: Self-pay | Admitting: Hematology and Oncology

## 2014-12-14 NOTE — Progress Notes (Unsigned)
Let a VM for patient concerning a Breast Mri that was Denied.  Explain to patient the authorization just give permission to have procedures done, not a guarantee of payment.  Payment is dependent upon medical review after claim is filed.  Left my number for patient to call me back.

## 2014-12-21 ENCOUNTER — Ambulatory Visit: Payer: BLUE CROSS/BLUE SHIELD | Admitting: Physical Therapy

## 2014-12-30 ENCOUNTER — Ambulatory Visit: Payer: BLUE CROSS/BLUE SHIELD | Admitting: Physical Therapy

## 2015-01-07 ENCOUNTER — Ambulatory Visit: Payer: BLUE CROSS/BLUE SHIELD | Attending: Plastic Surgery | Admitting: Physical Therapy

## 2015-01-07 DIAGNOSIS — M6281 Muscle weakness (generalized): Secondary | ICD-10-CM | POA: Diagnosis present

## 2015-01-07 DIAGNOSIS — R293 Abnormal posture: Secondary | ICD-10-CM | POA: Diagnosis present

## 2015-01-07 DIAGNOSIS — Z9189 Other specified personal risk factors, not elsewhere classified: Secondary | ICD-10-CM | POA: Insufficient documentation

## 2015-01-07 NOTE — Therapy (Signed)
[place], [place], [address] Phone: [phone]   Fax:  [phone]  Physical Therapy Evaluation  Patient Details  Name: Katherine Acosta MRN: [id number] Date of Birth: 02/21/1961 Referring Provider:  Crissie Reese, MD  Encounter Date: 01/07/2015      PT End of Session - 01/07/15 1734    Visit Number 1   Number of Visits 4  spread out visits, pt may not need all of them    Date for PT Re-Evaluation 02/20/15   PT Start Time 1420   PT Stop Time 1505   PT Time Calculation (min) 45 min   Activity Tolerance Patient tolerated treatment well   Behavior During Therapy [facility] for tasks assessed/performed      Past Medical History  Diagnosis Date  . Hypertension   . Diabetes mellitus without complication     type 2  . Anxiety   . Depression   . Headache     migraines  . Cancer     left breast cancer - march, 2015  . Anemia     low iron  . History of hiatal hernia   . Diarrhea   . PONV (postoperative nausea and vomiting)     also has difficulty waking up    Past Surgical History  Procedure Laterality Date  . Esophagus surgery      had stomach rewrapped around esophagus  . Tonsillectomy    . Cholecystectomy    . Mastectomy    . Breast surgery      breast reduction on right  . Abdominal hysterectomy    . Colonoscopy    . Knee arthroscopy Left   . Mastectomy Left 07/2013  . Breast reconstruction Left 05/21/2014    DR BOWERS  . Latissimus flap to breast Left 05/21/2014    Procedure: LATISSIMUS FLAP TO LEFT BREAST WITH PLACEMENT OF IMPLANT FOR RECONSTRUCTION;  Surgeon: Crissie Reese, MD;  Location: [facility];  Service: Plastics;  Laterality: Left;    There were no vitals filed for this visit.  Visit Diagnosis:  Decreased muscle strength  Abnormal posture  At risk for lymphedema      Subjective Assessment - 01/07/15 1434    Subjective pt states her arm does not feel "right" pain in axilla and  medial upper arm at times even into neck  she feels like she is pretty well healed up but is concerned about puffiness in axilla and tired feeling in arm    Pertinent History left mastectomy  April 15 with  at least 3 nodes removed.Pt reports complications from reconstrction with expanders, implant that had problems and ultimately  a latt flap May 21, 2014.     Patient Stated Goals to get a sleeve and to get rid of tiredness in left arm    Currently in Pain? Yes   Pain Score 2    Pain Location Axilla   Pain Orientation Left   Pain Descriptors / Indicators Aching;Dull   Pain Type Acute pain   Pain Radiating Towards toward left upper arm    Aggravating Factors  using left arm  "it gets tired"    Pain Relieving Factors rest   Effect of Pain on Daily Activities re            [facility] PT Assessment - 01/07/15 0001    Assessment   Medical Diagnosis breast cancer   Onset Date/Surgical Date 06/29/13   Precautions   Precautions Other (comment)   Precaution Comments  cancer   Restrictions   Weight Bearing Restrictions No   Balance Screen   Has the patient fallen in the past 6 months No   Has the patient had a decrease in activity level because of a fear of falling?  No   Is the patient reluctant to leave their home because of a fear of falling?  No   Home Environment   Living Environment Private residence   Living Arrangements Spouse/significant other   Available Help at Discharge Available PRN/intermittently   Type of [place] to enter   Entrance Stairs-Number of Steps 3   Entrance Stairs-Rails Right   [place] One level   [place] None   Prior Function   Level of Independence Independent with basic ADLs;Independent with homemaking with ambulation;Independent with transfers;Independent with gait   Vocation Full time employment   Vocation Requirements typing, data entry, drive cars    Leisure baking, cake decorating, crafting , reading    Cognition    Overall Cognitive Status Within Functional Limits for tasks assessed   Observation/Other Assessments   Observations pt obese with generalized muscle atrophy.  she has healed incision on left back   Skin Integrity --   Observation/Other Assessments-Edema    Edema --  pt reports "puffiness in left axilla and under left arm   Sensation   Additional Comments --   Coordination   Gross Motor Movements are Fluid and Coordinated Yes   Functional Tests   Functional tests --  Quick DASH 22.73   Other:   Other/ Comments lymphedema life impact scale 19 or 28% impaired by lymphedema   Posture/Postural Control   Posture/Postural Control Postural limitations   Postural Limitations Rounded Shoulders;Forward head   AROM   Right Shoulder Extension 54 Degrees   Right Shoulder Flexion 166 Degrees   Right Shoulder ABduction 165 Degrees   Right Shoulder Internal Rotation 37 Degrees   Right Shoulder External Rotation 85 Degrees   Left Shoulder Extension 50 Degrees   Left Shoulder Flexion 158 Degrees  pain   Left Shoulder ABduction 160 Degrees  pain   Left Shoulder Internal Rotation 64 Degrees   Left Shoulder External Rotation 85 Degrees   Strength   Overall Strength Deficits   Overall Strength Comments when she uses left arm it "trembles"    Right Shoulder Flexion 4+/5   Right Shoulder Extension 4+/5   Right Shoulder ABduction 4+/5   Right Shoulder Internal Rotation 4+/5   Right Shoulder External Rotation 4+/5   Left Shoulder Flexion 4-/5  pain with resisted left shoulder movements   Left Shoulder Extension 4-/5   Left Shoulder ABduction 4-/5   Left Shoulder Internal Rotation 4/5   Left Shoulder External Rotation 4-/5   Palpation   Palpation comment --           LYMPHEDEMA/ONCOLOGY QUESTIONNAIRE - 01/07/15 1449    Left Upper Extremity Lymphedema   10 cm Proximal to Olecranon Process 34.8 cm   Olecranon Process 26.8 cm   15 cm Proximal to Ulnar Styloid Process 26.5 cm   Just  Proximal to Ulnar Styloid Process 15.9 cm   Across Hand at [employer] 18.4 cm   At [place] of 2nd Digit 5.9 cm           Quick Dash - 01/07/15 0001    Open a tight or new jar Mild difficulty   Do heavy household chores (wash walls, wash floors) Mild difficulty   Carry  a shopping bag or briefcase Mild difficulty   Wash your back Mild difficulty   Use a knife to cut food Mild difficulty   Recreational activities in which you take some force or impact through your arm, shoulder, or hand (golf, hammering, tennis) Mild difficulty   During the past week, to what extent has your arm, shoulder or hand problem interfered with your normal social activities with family, friends, neighbors, or groups? Slightly   During the past week, to what extent has your arm, shoulder or hand problem limited your work or other regular daily activities Not at all   Arm, shoulder, or hand pain. Mild   Tingling (pins and needles) in your arm, shoulder, or hand Mild   Difficulty Sleeping Moderate difficulty   DASH Score 25 %             [facility] Adult PT Treatment/Exercise - 01/07/15 0001    Shoulder Exercises: Supine   Other Supine Exercises supine scapular series 5 reps with green theraband                 PT Education - 01/07/15 1734    Education provided Yes   Education Details supne scapular series with green theraband   Person(s) Educated Patient   Methods Explanation;Demonstration;Handout   Comprehension Verbalized understanding;Returned demonstration           Short Term Clinic Goals - 01/07/15 1741    CC Short Term Goal  #1   Title short term goals= long term goals             Long Term Clinic Goals - 01/07/15 1741    CC Long Term Goal  #1   Title Pt will report that she has 50% less discomfort in her left arm so that she can perform her duties at work with greater ease   Time 6   Period Weeks   Status New   CC Long Term Goal  #2   Title pt will be independent in a  home exercise program for strength improvement   Time 6   Period Weeks   Status New   CC Long Term Goal  #5   Title Pt will state that she has compression garments on order   Time 6   Period Weeks   Status New            Plan - 01/07/15 1736    Clinical Impression Statement Ms Gottsch returns with continuing symptoms of fatigue in arm with intermittent pain and puffiness in lateral chest under her arm. She has prescription for compression sleeve and feels that [employer] to nature still has her prescription for compression bra. She wants to increase her strength    Pt will benefit from skilled therapeutic intervention in order to improve on the following deficits Decreased strength;Decreased activity tolerance;Postural dysfunction   Rehab Potential Excellent   Clinical Impairments Affecting Rehab Potential multiple surgeries on left chest area   PT Frequency --  4 visits over 6 weeks if needed   PT Treatment/Interventions Therapeutic exercise;Patient/family education;Therapeutic activities;ADLs/Self Care Home Management   PT Next Visit Plan assess effectveness of supine scapular series. Progress to standing therband scapular work ( wall walking, elbow pull backs, forearm slides)    Consulted and Agree with Plan of Care Patient         Problem List Patient Active Problem List   Diagnosis Date Noted  . Breast cancer, left breast 05/21/2014  . Breast cancer of lower-outer quadrant  of left female breast 05/18/2014  . STRICTURE AND STENOSIS OF ESOPHAGUS 06/28/2009  . BARRETTS ESOPHAGUS 06/28/2009  . ABDOMINAL PAIN, EPIGASTRIC 06/28/2009  . CHEST PAIN UNSPECIFIED 04/28/2009  . GERD 04/21/2009    Donato Heinz. Owens Shark PT   Norwood Levo 01/07/2015, 5:50 PM  [place], [place], [address] Phone: [phone]   Fax:  [phone]

## 2015-01-07 NOTE — Patient Instructions (Signed)
Over Head Pull: Narrow Grip     [place] [phone]   On back, knees bent, feet flat, band across thighs, elbows straight but relaxed. Pull hands apart (start). Keeping elbows straight, bring arms up and over head, hands toward floor. Keep pull steady on band. Hold momentarily. Return slowly, keeping pull steady, back to start. Repeat _5-10__ times. Band color _green_____   Side Pull: Double Arm   On back, knees bent, feet flat. Arms perpendicular to body, shoulder level, elbows straight but relaxed. Pull arms out to sides, elbows straight. Resistance band comes across collarbones, hands toward floor. Hold momentarily. Slowly return to starting position. Repeat 5-10___ times. Band color __green___   Sash   On back, knees bent, feet flat, left hand on left hip, right hand above left. Pull right arm DIAGONALLY (hip to shoulder) across chest. Bring right arm along head toward floor. Hold momentarily. Slowly return to starting position. Repeat _5-10__ times. Do with left arm. Band color green______   Shoulder Rotation: Double Arm   On back, knees bent, feet flat, elbows tucked at sides, bent 90, hands palms up. Pull hands apart and down toward floor, keeping elbows near sides. Hold momentarily. Slowly return to starting position. Repeat _5-10__ times. Band color __green____

## 2015-01-21 ENCOUNTER — Ambulatory Visit: Payer: BLUE CROSS/BLUE SHIELD | Admitting: Physical Therapy

## 2015-02-08 ENCOUNTER — Telehealth: Payer: Self-pay | Admitting: Physical Therapy

## 2015-02-08 NOTE — Telephone Encounter (Signed)
Phoned patient to tell her that her compression sleeve and glove prescription has been faxed to [place], as she had requested last week or so.

## 2015-02-09 ENCOUNTER — Ambulatory Visit: Payer: BLUE CROSS/BLUE SHIELD | Admitting: Physical Therapy

## 2015-02-23 ENCOUNTER — Ambulatory Visit: Payer: BLUE CROSS/BLUE SHIELD | Attending: Plastic Surgery | Admitting: Physical Therapy

## 2015-02-23 DIAGNOSIS — R5381 Other malaise: Secondary | ICD-10-CM | POA: Insufficient documentation

## 2015-02-23 DIAGNOSIS — M7582 Other shoulder lesions, left shoulder: Secondary | ICD-10-CM | POA: Diagnosis not present

## 2015-02-23 DIAGNOSIS — M25612 Stiffness of left shoulder, not elsewhere classified: Secondary | ICD-10-CM

## 2015-02-23 NOTE — Therapy (Addendum)
[place], [place], [address] Phone: [phone]   Fax:  [phone]  Physical Therapy Treatment  Patient Details  Name: Oaklie Durrett MRN: [id number] Date of Birth: Jun 10, 1960 No Data Recorded  Encounter Date: 02/23/2015      PT End of Session - 02/23/15 2123    Visit Number 2   Number of Visits 4   Date for PT Re-Evaluation 04/20/15   PT Start Time 1526   PT Stop Time 1620   PT Time Calculation (min) 54 min   Activity Tolerance Patient tolerated treatment well   Behavior During Therapy [facility] for tasks assessed/performed      Past Medical History  Diagnosis Date  . Hypertension   . Diabetes mellitus without complication     type 2  . Anxiety   . Depression   . Headache     migraines  . Cancer     left breast cancer - march, 2015  . Anemia     low iron  . History of hiatal hernia   . Diarrhea   . PONV (postoperative nausea and vomiting)     also has difficulty waking up    Past Surgical History  Procedure Laterality Date  . Esophagus surgery      had stomach rewrapped around esophagus  . Tonsillectomy    . Cholecystectomy    . Mastectomy    . Breast surgery      breast reduction on right  . Abdominal hysterectomy    . Colonoscopy    . Knee arthroscopy Left   . Mastectomy Left 07/2013  . Breast reconstruction Left 05/21/2014    DR BOWERS  . Latissimus flap to breast Left 05/21/2014    Procedure: LATISSIMUS FLAP TO LEFT BREAST WITH PLACEMENT OF IMPLANT FOR RECONSTRUCTION;  Surgeon: Crissie Reese, MD;  Location: [facility];  Service: Plastics;  Laterality: Left;    There were no vitals filed for this visit.  Visit Diagnosis:  Decreased range of motion of left shoulder - Plan: PT plan of care cert/re-cert  Physical deconditioning - Plan: PT plan of care cert/re-cert      Subjective Assessment - 02/23/15 1526    Subjective The arm has been tired; to lift it up has been sore--I do  the exercises but then it goes right back into it.  Hopes to get an earpiece for phoning at work instead of holding phone between shoulder and ear.  Has grandchild of 1 year whom she holds and will try to hold her more with the right now than the left.  Has been doing Theraband exercises (scapular series) every other day.   Currently in Pain? Yes   Pain Score 5    Pain Location Arm   Pain Orientation Left   Pain Descriptors / Indicators Sore   Aggravating Factors  using it, lifting the arm and stretching it out   Pain Relieving Factors muscle relaxer and/or tylenol            [facility] PT Assessment - 02/23/15 0001    ROM / Strength   AROM / PROM / Strength AROM   AROM   AROM Assessment Site Cervical   Cervical Flexion WFL   Cervical Extension 10% loss   Cervical - Right Side Bend 25+% loss  pulls on left   Cervical - Left Side Bend WFL   Cervical - Right Rotation 10% loss  pulls on left   Cervical - Left Rotation [facility]  pinches in back   Palpation   Palpation comment tender around left shoulder joint area with palpation   Special Tests    Special Tests Cervical;Rotator Cuff Impingement   Cervical Tests Dictraction   Rotator Cuff Impingment tests other   Distraction Test   Findngs Positive   Comment "feels good"   other   Comments pain with IR and flexion of left shouder; also with resisted flexion                     [facility] Adult PT Treatment/Exercise - 02/23/15 0001    Self-Care   Self-Care Other Self-Care Comments   Other Self-Care Comments  Checked fit of patient's sleeve and gauntlet, and discussed when to wear these (for flying and more vigorous activity).  [facility] [facility], as she cannot do this) and about yoga at the [place] Other Exercises   Other Exercises  Neck A/AAROM for sidebend and rotation.   Manual Therapy   Manual Therapy Passive ROM;Manual Traction;Myofascial release    Myofascial Release In supine, O-A cervical release.   Passive ROM Neck sidebend and rotation both directions.   Manual Traction Gentle cervical manual traction.                PT Education - 02/23/15 1626    Education provided Yes   Education Details neck AROM, shoulder rolls, scapular retractor stretches, trunk rotation stretches, shoulder ER with scapular retraction   Person(s) Educated Patient   Methods Explanation;Demonstration;Handout   Comprehension Verbalized understanding           Short Term Clinic Goals - 01/07/15 1741    CC Short Term Goal  #1   Title short term goals= long term goals             Long Term Clinic Goals - 02/23/15 1530    CC Long Term Goal  #1   Title Pt will report that she has 50% less discomfort in her left arm so that she can perform her duties at work with greater ease   Status On-going   CC Long Term Goal  #2   Title pt will be independent in a home exercise program for strength improvement   Status On-going   CC Long Term Goal  #5   Title Pt will state that she has compression garments on order   Status Achieved            Plan - 02/23/15 2124    Clinical Impression Statement She describes ongoing fatigue and pain in arm and neck.  Left shoulder area is tender to palpation and painful with various impingement tests; neck is painful and tight.  Pt. will likely benefit from basic stretching exercises and strengthening when able, but suggested that if things don't improve, she might consider an orthopedic consult.   Pt will benefit from skilled therapeutic intervention in order to improve on the following deficits Decreased range of motion;Pain;Decreased strength   Rehab Potential Good   Clinical Impairments Affecting Rehab Potential multiple surgeries on left chest area   PT Frequency --  4 treatment visits as needed   PT Treatment/Interventions ADLs/Self Care Home Management;Therapeutic exercise;Patient/family  education;Passive range of motion;Manual techniques   PT Next Visit Plan Patient will call in a month to report on whether she feel she needs follow-up treatment visit.  If so, continue manual work and instruction in UE strengthening.  PT Home Exercise Plan see education section   Consulted and Agree with Plan of Care Patient        Problem List Patient Active Problem List   Diagnosis Date Noted  . Breast cancer, left breast ([place]) 05/21/2014  . Breast cancer of lower-outer quadrant of left female breast ([place]) 05/18/2014  . STRICTURE AND STENOSIS OF ESOPHAGUS 06/28/2009  . BARRETTS ESOPHAGUS 06/28/2009  . ABDOMINAL PAIN, EPIGASTRIC 06/28/2009  . CHEST PAIN UNSPECIFIED 04/28/2009  . GERD 04/21/2009    Magdaleno Lortie 02/23/2015, 9:37 PM      PHYSICAL THERAPY DISCHARGE SUMMARY  Visits from Start of Care: 2  Current functional level related to goals / functional outcomes: unknown   Remaining deficits: unknown   Education / Equipment: As above  Plan: Patient agrees to discharge.  Patient goals were not met. Patient is being discharged due to not returning since the last visit.  ?????         Maudry Diego, PT 06/09/2015 1:32 PM    [place], [place], [address] Phone: [phone]   Fax:  [phone]  Name: Betha Shadix MRN: [id number] Date of Birth: 1960/07/28    Serafina Royals, PT 02/23/2015 9:37 PM

## 2015-02-23 NOTE — Patient Instructions (Signed)
Side Bend, Sitting    Sit, head in comfortable, centered position, chin slightly tucked. Gently tilt head, bringing ear toward same-side shoulder. Hold __5_ seconds.  Repeat __3-5_ times per session. Do _3-5__ sessions per day.  OR DO THE ONE JUST BELOW:  Copyright  VHI. All rights reserved.  Side Bend, Sitting    Sit, hand over top of head. Gently pull head to one side. Hold _5__ seconds. Repeat _3-5__ times per session. Do _3-5__ sessions per day.  Copyright  VHI. All rights reserved.  AROM, Rotation with Self-Assist    Sit or stand, one hand on same-side jaw. Turn head slowly to look over shoulder. Increase stretch by gently pushing with hand on jaw. Hold _5__ seconds.  Repeat __3-5_ times per session. Do _3-5__ sessions per day.  Copyright  VHI. All rights reserved.  Rotation, Standing    Stand, arms extended forward. Twist to one side looking that direction as far as possible. Hold __5_ seconds. Repeat to other side. Repeat __3-5_ times per session. Do __2_ sessions per day.  Copyright  VHI. All rights reserved.  Scapular Retractors, Standing    Stand, arms crossed at shoulder height, one hand holding other elbow. Slowly pull toward body. A slight stretch should be felt behind shoulder and into back. Hold _5__ seconds. Repeat _3-5__ times per session. Do _3-5__ sessions per day.  Copyright  VHI. All rights reserved.    Sitting or standing:  With arms relaxed at your sides, roll shoulders backwards and circle around slowly. Do 3-5 times, 3-5 times a day.  With upper arms at your sides and elbows bent to 90 degrees, turn hands outward and then squeeze shoulder blades together, 3-5 times, 3-5x/day.

## 2015-03-23 ENCOUNTER — Other Ambulatory Visit: Payer: Self-pay | Admitting: Internal Medicine

## 2015-03-23 DIAGNOSIS — R112 Nausea with vomiting, unspecified: Secondary | ICD-10-CM

## 2015-03-23 DIAGNOSIS — R197 Diarrhea, unspecified: Principal | ICD-10-CM

## 2015-04-01 ENCOUNTER — Ambulatory Visit
Admission: RE | Admit: 2015-04-01 | Discharge: 2015-04-01 | Disposition: A | Discharge status: Home / Self Care | Payer: BLUE CROSS/BLUE SHIELD | Source: Ambulatory Visit | Attending: Internal Medicine | Admitting: Internal Medicine

## 2015-04-01 DIAGNOSIS — R112 Nausea with vomiting, unspecified: Secondary | ICD-10-CM

## 2015-04-01 DIAGNOSIS — R197 Diarrhea, unspecified: Principal | ICD-10-CM

## 2015-05-18 ENCOUNTER — Encounter: Payer: Self-pay | Admitting: Gastroenterology

## 2015-05-19 NOTE — Assessment & Plan Note (Signed)
Left breast invasive ductal carcinoma multifocal disease, 2 cm, 2 cm, 4 mm, T1 cN0 M0 stage IA 3 SLN negative but it showed IHC positive for micrometastatic disease less than 200 cells accompanied by extensive DCIS grade 2 margins negative, no lymphovascular invasion or dermal invasion, Oncotype DX recurrence score 15 (9% ROR) and 6 (5% ROR) on each of the tumors with third tumor could not be done. Current treatment: Letrozole started May 2015  Right breast atypical lobular hyperplasia detected during reduction mammoplasty surgery Mammogram 11/05/2014: Negative MRI 11/13/2014: No MRI evidence of malignancy Our plan is to get annual mammograms in the future. I do not believe there is any need for annual MRIs. Return to clinic in 1 year for follow-up

## 2015-05-20 ENCOUNTER — Ambulatory Visit (HOSPITAL_BASED_OUTPATIENT_CLINIC_OR_DEPARTMENT_OTHER): Payer: BLUE CROSS/BLUE SHIELD | Admitting: Hematology and Oncology

## 2015-05-20 ENCOUNTER — Telehealth: Payer: Self-pay | Admitting: Hematology and Oncology

## 2015-05-20 ENCOUNTER — Encounter: Payer: Self-pay | Admitting: Hematology and Oncology

## 2015-05-20 ENCOUNTER — Other Ambulatory Visit: Payer: Self-pay | Admitting: *Deleted

## 2015-05-20 VITALS — BP 113/70 | HR 98 | Temp 97.9°F | Resp 18 | Ht 62.0 in | Wt 184.0 lb

## 2015-05-20 DIAGNOSIS — C50512 Malignant neoplasm of lower-outer quadrant of left female breast: Secondary | ICD-10-CM

## 2015-05-20 MED ORDER — LETROZOLE 2.5 MG PO TABS: 2.5000 mg | ORAL_TABLET | Freq: Every day | ORAL | Status: DC

## 2015-05-20 NOTE — Progress Notes (Signed)
Patient Care Team: Patric Dykes, MD as PCP - General (Family Medicine)  DIAGNOSIS: Breast cancer of lower-outer quadrant of left female breast [facility])   Staging form: Breast, AJCC 7th Edition     Pathologic: Stage IA (T1c, N0(i+), cM0) - Signed by Rulon Eisenmenger, MD on 05/18/2014   SUMMARY OF ONCOLOGIC HISTORY:   Breast cancer of lower-outer quadrant of left female breast ([place])   07/16/2013 Procedure No BRCA1 or 2 PA LB2 mutation   08/14/2013 Surgery Left breast mastectomy: Multifocal invasive ductal carcinoma 2 cm, 2 cm, 4 mm, focal DCIS, focal LCIS, margins negative, 1 node had micrometastatic disease less than 200 cells, Oncotype DX score is 15 and 6, well-differentiated, followed by reconstruction   09/15/2013 -  Anti-estrogen oral therapy Letrozole 2.5 mg by mouth daily   03/31/2014 Surgery Reduction mammoplasty on the right breast revealed atypical lobular hyperplasia    CHIEF COMPLIANT:  Follow-up of left breast cancer on letrozole  INTERVAL HISTORY: Katherine Acosta is a  55 year old with above-mentioned history of left breast cancer treated with mastectomy and reconstruction she also underwent reduction mammoplasty in the right breast which showed atypical lobular hyperplasia. She is currently on letrozole therapy since May 2015. She has hot flashes related to letrozole but they have not been too severe. She denies any lumps or nodules in the breast. She has some mild difficulty with elevation of arm above the shoulder as well as discomfort underneath the arm. She feels palpable nodularity in the breast and is slightly concerned.  REVIEW OF SYSTEMS:   Constitutional: Denies fevers, chills or abnormal weight loss Eyes: Denies blurriness of vision Ears, nose, mouth, throat, and face: Denies mucositis or sore throat Respiratory: Denies cough, dyspnea or wheezes Cardiovascular: Denies palpitation, chest discomfort Gastrointestinal:  Denies nausea, heartburn or change in bowel habits Skin:  Denies abnormal skin rashes Lymphatics: Denies new lymphadenopathy or easy bruising Neurological:Denies numbness, tingling or new weaknesses Behavioral/Psych: Mood is stable, no new changes  Extremities: No lower extremity edema , slight impairment of range of motion the right arm Breast:  Mild breast swelling with nodularity along the scar tissue All other systems were reviewed with the patient and are negative.  I have reviewed the past medical history, past surgical history, social history and family history with the patient and they are unchanged from previous note.  ALLERGIES:  is allergic to tape.  MEDICATIONS:  Current Outpatient Prescriptions  Medication Sig Dispense Refill  . benazepril (LOTENSIN) 20 MG tablet Take 20 mg by mouth daily.     . Calcium Carbonate-Vit D-Min (CALCIUM 1200 PO) Take by mouth daily.    . Cyanocobalamin (B-12) 5000 MCG CAPS Take by mouth daily.    . cyclobenzaprine (FLEXERIL) 10 MG tablet   1  . letrozole (FEMARA) 2.5 MG tablet Take 1 tablet (2.5 mg total) by mouth daily. 90 tablet 3  . pantoprazole (PROTONIX) 40 MG tablet Take 40 mg by mouth daily.     . sitaGLIPtin-metformin (JANUMET) 50-1000 MG per tablet Take 1 tablet by mouth daily.    Marland Kitchen UNABLE TO FIND 1,000 mg daily. Magnesium    . venlafaxine XR (EFFEXOR-XR) 150 MG 24 hr capsule Take 150 mg by mouth daily with breakfast.     No current facility-administered medications for this visit.    PHYSICAL EXAMINATION: ECOG PERFORMANCE STATUS: 1 - Symptomatic but completely ambulatory  Filed Vitals:   05/20/15 0819  BP: 113/70  Pulse: 98  Temp: 97.9 F (36.6 C)  Resp:  18   Filed Weights   05/20/15 0819  Weight: 184 lb (83.462 kg)    GENERAL:alert, no distress and comfortable SKIN: skin color, texture, turgor are normal, no rashes or significant lesions EYES: normal, Conjunctiva are pink and non-injected, sclera clear OROPHARYNX:no exudate, no erythema and lips, buccal mucosa, and tongue  normal  NECK: supple, thyroid normal size, non-tender, without nodularity LYMPH:  no palpable lymphadenopathy in the cervical, axillary or inguinal LUNGS: clear to auscultation and percussion with normal breathing effort HEART: regular rate & rhythm and no murmurs and no lower extremity edema ABDOMEN:abdomen soft, non-tender and normal bowel sounds MUSCULOSKELETAL:no cyanosis of digits and no clubbing  NEURO: alert & oriented x 3 with fluent speech, no focal motor/sensory deficits EXTREMITIES: No lower extremity edema BREAST: palpable nodularity around the scar tissue. Mild breast swelling not significant, lymphedema. Discomfort under the right arm but no palpable nodularity. (exam performed in the presence of a chaperone)  LABORATORY DATA:  I have reviewed the data as listed   Chemistry      Component Value Date/Time   NA 142 12/10/2014 1355   NA 140 05/21/2014 1149   K 4.6 12/10/2014 1355   K 3.8 05/21/2014 1149   CL 105 05/21/2014 1149   CO2 26 12/10/2014 1355   CO2 23 05/21/2014 1149   BUN 8.9 12/10/2014 1355   BUN 6 05/21/2014 1149   CREATININE 0.8 12/10/2014 1355   CREATININE 0.63 05/21/2014 1149      Component Value Date/Time   CALCIUM 9.3 12/10/2014 1355   CALCIUM 9.4 05/21/2014 1149   ALKPHOS 71 12/10/2014 1355   AST 46* 12/10/2014 1355   ALT 61* 12/10/2014 1355   BILITOT 0.47 12/10/2014 1355       Lab Results  Component Value Date   WBC 5.4 05/21/2014   HGB 13.0 05/21/2014   HCT 36.6 05/21/2014   MCV 87.8 05/21/2014   PLT 236 05/21/2014     ASSESSMENT & PLAN:  Breast cancer of lower-outer quadrant of left female breast Left breast invasive ductal carcinoma multifocal disease, 2 cm, 2 cm, 4 mm, T1 cN0 M0 stage IA 3 SLN negative but it showed IHC positive for micrometastatic disease less than 200 cells accompanied by extensive DCIS grade 2 margins negative, no lymphovascular invasion or dermal invasion, Oncotype DX recurrence score 15 (9% ROR) and 6 (5% ROR)  on each of the tumors with third tumor could not be done.  Current treatment: Letrozole started May 2015 Letrozole toxicities: 1. Hot flashes 2. Difficulty falling asleep at bedtime 3. Fatigue     However overall she appears to be tolerating the treatment fairly well.   Discomfort underneath the left axilla related to nerve damage rather than lymphedema.   Right breast atypical lobular hyperplasia detected during reduction mammoplasty surgery Mammogram 11/05/2014: Negative MRI 11/13/2014: No MRI evidence of malignancy  Breast Cancer Surveillance: 1. Breast exam 05/20/2015: Normal 2. Mammogram patient will need a mammogram on the right breast in March of every year.   Return to clinic in 1 year for follow-up   No orders of the defined types were placed in this encounter.   The patient has a good understanding of the overall plan. she agrees with it. she will call with any problems that may develop before the next visit here.   Rulon Eisenmenger, MD 05/20/2015

## 2015-05-20 NOTE — Telephone Encounter (Signed)
Appointments made and avs printed for patient °

## 2015-06-14 ENCOUNTER — Other Ambulatory Visit (HOSPITAL_BASED_OUTPATIENT_CLINIC_OR_DEPARTMENT_OTHER): Payer: BLUE CROSS/BLUE SHIELD

## 2015-06-14 ENCOUNTER — Ambulatory Visit (HOSPITAL_BASED_OUTPATIENT_CLINIC_OR_DEPARTMENT_OTHER): Payer: BLUE CROSS/BLUE SHIELD

## 2015-06-14 VITALS — BP 127/87 | HR 93 | Temp 98.4°F

## 2015-06-14 DIAGNOSIS — C50512 Malignant neoplasm of lower-outer quadrant of left female breast: Secondary | ICD-10-CM

## 2015-06-14 DIAGNOSIS — Z79811 Long term (current) use of aromatase inhibitors: Secondary | ICD-10-CM

## 2015-06-14 LAB — COMPREHENSIVE METABOLIC PANEL
ALT: 40 U/L (ref 0–55)
AST: 25 U/L (ref 5–34)
Albumin: 4 g/dL (ref 3.5–5.0)
Alkaline Phosphatase: 71 U/L (ref 40–150)
Anion Gap: 13 mEq/L — ABNORMAL HIGH (ref 3–11)
BUN: 10.9 mg/dL (ref 7.0–26.0)
CALCIUM: 9.7 mg/dL (ref 8.4–10.4)
CHLORIDE: 106 meq/L (ref 98–109)
CO2: 23 meq/L (ref 22–29)
Creatinine: 0.8 mg/dL (ref 0.6–1.1)
EGFR: 86 mL/min/{1.73_m2} — ABNORMAL LOW (ref >90)
GLUCOSE: 112 mg/dL (ref 70–140)
POTASSIUM: 3.8 meq/L (ref 3.5–5.1)
SODIUM: 142 meq/L (ref 136–145)
Total Bilirubin: 0.4 mg/dL (ref 0.20–1.20)
Total Protein: 7.1 g/dL (ref 6.4–8.3)

## 2015-06-14 LAB — CBC WITH DIFFERENTIAL/PLATELET
BASO%: 0.2 % (ref 0.0–2.0)
BASOS ABS: 0 10*3/uL (ref 0.0–0.1)
EOS ABS: 0.1 10*3/uL (ref 0.0–0.5)
EOS%: 1.7 % (ref 0.0–7.0)
HEMATOCRIT: 39 % (ref 34.8–46.6)
HGB: 13.3 g/dL (ref 11.6–15.9)
LYMPH%: 37.7 % (ref 14.0–49.7)
MCH: 30.4 pg (ref 25.1–34.0)
MCHC: 34.1 g/dL (ref 31.5–36.0)
MCV: 89 fL (ref 79.5–101.0)
MONO#: 0.4 10*3/uL (ref 0.1–0.9)
MONO%: 5.4 % (ref 0.0–14.0)
NEUT%: 55 % (ref 38.4–76.8)
NEUTROS ABS: 3.6 10*3/uL (ref 1.5–6.5)
PLATELETS: 221 10*3/uL (ref 145–400)
RBC: 4.38 10*6/uL (ref 3.70–5.45)
RDW: 12.8 % (ref 11.2–14.5)
WBC: 6.5 10*3/uL (ref 3.9–10.3)
lymph#: 2.4 10*3/uL (ref 0.9–3.3)
nRBC: 0 % (ref 0–0)

## 2015-06-14 MED ORDER — DENOSUMAB 60 MG/ML ~~LOC~~ SOLN
60.0000 mg | Freq: Once | SUBCUTANEOUS | Status: AC
  Administered 2015-06-14: 60 mg via SUBCUTANEOUS
  Filled 2015-06-14: qty 1

## 2015-11-08 ENCOUNTER — Ambulatory Visit
Admission: RE | Admit: 2015-11-08 | Discharge: 2015-11-08 | Disposition: A | Discharge status: Home / Self Care | Payer: BLUE CROSS/BLUE SHIELD | Source: Ambulatory Visit | Attending: Hematology and Oncology | Admitting: Hematology and Oncology

## 2015-11-08 ENCOUNTER — Other Ambulatory Visit: Payer: Self-pay | Admitting: Hematology and Oncology

## 2015-11-08 DIAGNOSIS — C50512 Malignant neoplasm of lower-outer quadrant of left female breast: Secondary | ICD-10-CM

## 2015-11-08 DIAGNOSIS — N644 Mastodynia: Secondary | ICD-10-CM

## 2015-12-09 ENCOUNTER — Other Ambulatory Visit: Payer: Self-pay | Admitting: *Deleted

## 2015-12-09 DIAGNOSIS — C50512 Malignant neoplasm of lower-outer quadrant of left female breast: Secondary | ICD-10-CM

## 2015-12-13 ENCOUNTER — Other Ambulatory Visit (HOSPITAL_BASED_OUTPATIENT_CLINIC_OR_DEPARTMENT_OTHER): Payer: BLUE CROSS/BLUE SHIELD

## 2015-12-13 ENCOUNTER — Ambulatory Visit (HOSPITAL_BASED_OUTPATIENT_CLINIC_OR_DEPARTMENT_OTHER): Payer: BLUE CROSS/BLUE SHIELD

## 2015-12-13 VITALS — BP 127/83 | HR 99 | Temp 98.4°F

## 2015-12-13 DIAGNOSIS — Z853 Personal history of malignant neoplasm of breast: Secondary | ICD-10-CM | POA: Diagnosis not present

## 2015-12-13 DIAGNOSIS — C50512 Malignant neoplasm of lower-outer quadrant of left female breast: Secondary | ICD-10-CM

## 2015-12-13 DIAGNOSIS — Z79811 Long term (current) use of aromatase inhibitors: Secondary | ICD-10-CM | POA: Diagnosis not present

## 2015-12-13 LAB — COMPREHENSIVE METABOLIC PANEL
ALT: 41 U/L (ref 0–55)
AST: 39 U/L — ABNORMAL HIGH (ref 5–34)
Albumin: 3.8 g/dL (ref 3.5–5.0)
Alkaline Phosphatase: 62 U/L (ref 40–150)
Anion Gap: 14 mEq/L — ABNORMAL HIGH (ref 3–11)
BILIRUBIN TOTAL: 0.52 mg/dL (ref 0.20–1.20)
BUN: 7.8 mg/dL (ref 7.0–26.0)
CHLORIDE: 101 meq/L (ref 98–109)
CO2: 23 meq/L (ref 22–29)
CREATININE: 0.7 mg/dL (ref 0.6–1.1)
Calcium: 9.7 mg/dL (ref 8.4–10.4)
EGFR: >90 mL/min/{1.73_m2} (ref >90)
GLUCOSE: 142 mg/dL — AB (ref 70–140)
Potassium: 3.6 mEq/L (ref 3.5–5.1)
SODIUM: 139 meq/L (ref 136–145)
TOTAL PROTEIN: 7.2 g/dL (ref 6.4–8.3)

## 2015-12-13 LAB — CBC WITH DIFFERENTIAL/PLATELET
BASO%: 0.5 % (ref 0.0–2.0)
Basophils Absolute: 0 10*3/uL (ref 0.0–0.1)
EOS ABS: 0 10*3/uL (ref 0.0–0.5)
EOS%: 0.8 % (ref 0.0–7.0)
HEMATOCRIT: 37.7 % (ref 34.8–46.6)
HEMOGLOBIN: 12.7 g/dL (ref 11.6–15.9)
LYMPH#: 2.1 10*3/uL (ref 0.9–3.3)
LYMPH%: 35 % (ref 14.0–49.7)
MCH: 29.6 pg (ref 25.1–34.0)
MCHC: 33.6 g/dL (ref 31.5–36.0)
MCV: 88.1 fL (ref 79.5–101.0)
MONO#: 0.3 10*3/uL (ref 0.1–0.9)
MONO%: 5.4 % (ref 0.0–14.0)
NEUT%: 58.3 % (ref 38.4–76.8)
NEUTROS ABS: 3.5 10*3/uL (ref 1.5–6.5)
PLATELETS: 221 10*3/uL (ref 145–400)
RBC: 4.28 10*6/uL (ref 3.70–5.45)
RDW: 14 % (ref 11.2–14.5)
WBC: 5.9 10*3/uL (ref 3.9–10.3)

## 2015-12-13 MED ORDER — DENOSUMAB 60 MG/ML ~~LOC~~ SOLN
60.0000 mg | Freq: Once | SUBCUTANEOUS | Status: AC
  Administered 2015-12-13: 60 mg via SUBCUTANEOUS
  Filled 2015-12-13: qty 1

## 2015-12-13 NOTE — Patient Instructions (Signed)
Denosumab injection  What is this medicine?  DENOSUMAB (den oh sue mab) slows bone breakdown. Prolia is used to treat osteoporosis in women after menopause and in men. Xgeva is used to prevent bone fractures and other bone problems caused by cancer bone metastases. Xgeva is also used to treat giant cell tumor of the bone.  This medicine may be used for other purposes; ask your health care provider or pharmacist if you have questions.  What should I tell my health care provider before I take this medicine?  They need to know if you have any of these conditions:  -dental disease  -eczema  -infection or history of infections  -kidney disease or on dialysis  -low blood calcium or vitamin D  -malabsorption syndrome  -scheduled to have surgery or tooth extraction  -taking medicine that contains denosumab  -thyroid or parathyroid disease  -an unusual reaction to denosumab, other medicines, foods, dyes, or preservatives  -pregnant or trying to get pregnant  -breast-feeding  How should I use this medicine?  This medicine is for injection under the skin. It is given by a health care professional in a hospital or clinic setting.  If you are getting Prolia, a special MedGuide will be given to you by the pharmacist with each prescription and refill. Be sure to read this information carefully each time.  For Prolia, talk to your pediatrician regarding the use of this medicine in children. Special care may be needed. For Xgeva, talk to your pediatrician regarding the use of this medicine in children. While this drug may be prescribed for children as young as 13 years for selected conditions, precautions do apply.  Overdosage: If you think you have taken too much of this medicine contact a poison control center or emergency room at once.  NOTE: This medicine is only for you. Do not share this medicine with others.  What if I miss a dose?  It is important not to miss your dose. Call your doctor or health care professional if you are  unable to keep an appointment.  What may interact with this medicine?  Do not take this medicine with any of the following medications:  -other medicines containing denosumab  This medicine may also interact with the following medications:  -medicines that suppress the immune system  -medicines that treat cancer  -steroid medicines like prednisone or cortisone  This list may not describe all possible interactions. Give your health care provider a list of all the medicines, herbs, non-prescription drugs, or dietary supplements you use. Also tell them if you smoke, drink alcohol, or use illegal drugs. Some items may interact with your medicine.  What should I watch for while using this medicine?  Visit your doctor or health care professional for regular checks on your progress. Your doctor or health care professional may order blood tests and other tests to see how you are doing.  Call your doctor or health care professional if you get a cold or other infection while receiving this medicine. Do not treat yourself. This medicine may decrease your body's ability to fight infection.  You should make sure you get enough calcium and vitamin D while you are taking this medicine, unless your doctor tells you not to. Discuss the foods you eat and the vitamins you take with your health care professional.  See your dentist regularly. Brush and floss your teeth as directed. Before you have any dental work done, tell your dentist you are receiving this medicine.  Do   not become pregnant while taking this medicine or for 5 months after stopping it. Women should inform their doctor if they wish to become pregnant or think they might be pregnant. There is a potential for serious side effects to an unborn child. Talk to your health care professional or pharmacist for more information.  What side effects may I notice from receiving this medicine?  Side effects that you should report to your doctor or health care professional as soon as  possible:  -allergic reactions like skin rash, itching or hives, swelling of the face, lips, or tongue  -breathing problems  -chest pain  -fast, irregular heartbeat  -feeling faint or lightheaded, falls  -fever, chills, or any other sign of infection  -muscle spasms, tightening, or twitches  -numbness or tingling  -skin blisters or bumps, or is dry, peels, or red  -slow healing or unexplained pain in the mouth or jaw  -unusual bleeding or bruising  Side effects that usually do not require medical attention (Report these to your doctor or health care professional if they continue or are bothersome.):  -muscle pain  -stomach upset, gas  This list may not describe all possible side effects. Call your doctor for medical advice about side effects. You may report side effects to FDA at 1-800-FDA-1088.  Where should I keep my medicine?  This medicine is only given in a clinic, doctor's office, or other health care setting and will not be stored at home.  NOTE: This sheet is a summary. It may not cover all possible information. If you have questions about this medicine, talk to your doctor, pharmacist, or health care provider.      2016, Elsevier/Gold Standard. (2011-10-16 12:37:47)

## 2015-12-14 ENCOUNTER — Telehealth: Payer: Self-pay

## 2015-12-14 NOTE — Telephone Encounter (Signed)
Pt reports surgery January 2016.  In last 2 months, she has experienced soreness, tingling and pain "like lighting bolts" at her surgical site.   I reassured pt this is normal result of the surgery due to trauma to nerves and tissues.    Pt voiced understanding and appreciation.

## 2016-03-16 ENCOUNTER — Telehealth: Payer: Self-pay

## 2016-03-16 ENCOUNTER — Other Ambulatory Visit: Payer: Self-pay

## 2016-03-16 DIAGNOSIS — C50512 Malignant neoplasm of lower-outer quadrant of left female breast: Secondary | ICD-10-CM

## 2016-03-16 MED ORDER — LETROZOLE 2.5 MG PO TABS: 2.5000 mg | ORAL_TABLET | Freq: Every day | ORAL | 3 refills | Status: DC

## 2016-03-16 NOTE — Telephone Encounter (Signed)
Returned pt vm regarding request to have her letrozole filled at a new pharmacy. Pt stated that she had her family dr refilled her letrozole for the last 3 months. Pt stated that when she tried to get filled at her pharmacy, the family dr is requesting for her to get filled through Dr. Lindi Adie, since her is managing this medication. Refilled her medication with the new requested pharmacy location, and told pt that she will be able to pick it up today. No further questions at this time.

## 2016-05-10 ENCOUNTER — Other Ambulatory Visit: Payer: Self-pay | Admitting: Emergency Medicine

## 2016-05-19 ENCOUNTER — Other Ambulatory Visit: Payer: BLUE CROSS/BLUE SHIELD

## 2016-05-19 ENCOUNTER — Ambulatory Visit: Payer: BLUE CROSS/BLUE SHIELD | Admitting: Hematology and Oncology

## 2016-06-14 ENCOUNTER — Other Ambulatory Visit: Payer: Self-pay | Admitting: Hematology and Oncology

## 2016-06-14 ENCOUNTER — Other Ambulatory Visit (HOSPITAL_BASED_OUTPATIENT_CLINIC_OR_DEPARTMENT_OTHER): Payer: BLUE CROSS/BLUE SHIELD

## 2016-06-14 DIAGNOSIS — C50512 Malignant neoplasm of lower-outer quadrant of left female breast: Secondary | ICD-10-CM

## 2016-06-14 DIAGNOSIS — Z170 Estrogen receptor positive status [ER+]: Principal | ICD-10-CM

## 2016-06-14 LAB — CBC WITH DIFFERENTIAL/PLATELET
BASO%: 0.5 % (ref 0.0–2.0)
Basophils Absolute: 0 10*3/uL (ref 0.0–0.1)
EOS%: 2 % (ref 0.0–7.0)
Eosinophils Absolute: 0.1 10*3/uL (ref 0.0–0.5)
HCT: 40.8 % (ref 34.8–46.6)
HGB: 14.1 g/dL (ref 11.6–15.9)
LYMPH#: 2 10*3/uL (ref 0.9–3.3)
LYMPH%: 31.1 % (ref 14.0–49.7)
MCH: 30.7 pg (ref 25.1–34.0)
MCHC: 34.5 g/dL (ref 31.5–36.0)
MCV: 88.7 fL (ref 79.5–101.0)
MONO#: 0.3 10*3/uL (ref 0.1–0.9)
MONO%: 5 % (ref 0.0–14.0)
NEUT%: 61.4 % (ref 38.4–76.8)
NEUTROS ABS: 3.9 10*3/uL (ref 1.5–6.5)
Platelets: 219 10*3/uL (ref 145–400)
RBC: 4.6 10*6/uL (ref 3.70–5.45)
RDW: 12.9 % (ref 11.2–14.5)
WBC: 6.4 10*3/uL (ref 3.9–10.3)

## 2016-06-14 LAB — COMPREHENSIVE METABOLIC PANEL
ALBUMIN: 4 g/dL (ref 3.5–5.0)
ALK PHOS: 82 U/L (ref 40–150)
ALT: 35 U/L (ref 0–55)
AST: 31 U/L (ref 5–34)
Anion Gap: 14 mEq/L — ABNORMAL HIGH (ref 3–11)
BILIRUBIN TOTAL: 0.36 mg/dL (ref 0.20–1.20)
BUN: 8.6 mg/dL (ref 7.0–26.0)
CO2: 23 mEq/L (ref 22–29)
Calcium: 9.7 mg/dL (ref 8.4–10.4)
Chloride: 105 mEq/L (ref 98–109)
Creatinine: 0.7 mg/dL (ref 0.6–1.1)
EGFR: >90 mL/min/{1.73_m2} (ref >90)
GLUCOSE: 95 mg/dL (ref 70–140)
POTASSIUM: 3.9 meq/L (ref 3.5–5.1)
Sodium: 142 mEq/L (ref 136–145)
TOTAL PROTEIN: 7.4 g/dL (ref 6.4–8.3)

## 2016-06-15 ENCOUNTER — Ambulatory Visit (HOSPITAL_BASED_OUTPATIENT_CLINIC_OR_DEPARTMENT_OTHER): Payer: BLUE CROSS/BLUE SHIELD

## 2016-06-15 ENCOUNTER — Ambulatory Visit (HOSPITAL_BASED_OUTPATIENT_CLINIC_OR_DEPARTMENT_OTHER): Payer: BLUE CROSS/BLUE SHIELD | Admitting: Adult Health

## 2016-06-15 ENCOUNTER — Encounter: Payer: Self-pay | Admitting: Adult Health

## 2016-06-15 ENCOUNTER — Ambulatory Visit: Payer: BLUE CROSS/BLUE SHIELD | Admitting: Hematology and Oncology

## 2016-06-15 VITALS — BP 121/66 | HR 96 | Temp 98.6°F | Resp 18 | Ht 62.0 in | Wt 173.6 lb

## 2016-06-15 DIAGNOSIS — Z853 Personal history of malignant neoplasm of breast: Secondary | ICD-10-CM

## 2016-06-15 DIAGNOSIS — C50512 Malignant neoplasm of lower-outer quadrant of left female breast: Secondary | ICD-10-CM

## 2016-06-15 DIAGNOSIS — E2839 Other primary ovarian failure: Secondary | ICD-10-CM

## 2016-06-15 DIAGNOSIS — Z170 Estrogen receptor positive status [ER+]: Secondary | ICD-10-CM | POA: Diagnosis not present

## 2016-06-15 DIAGNOSIS — Z79811 Long term (current) use of aromatase inhibitors: Secondary | ICD-10-CM

## 2016-06-15 MED ORDER — DENOSUMAB 60 MG/ML ~~LOC~~ SOLN
60.0000 mg | Freq: Once | SUBCUTANEOUS | Status: AC
  Administered 2016-06-15: 60 mg via SUBCUTANEOUS
  Filled 2016-06-15: qty 1

## 2016-06-15 NOTE — Patient Instructions (Signed)
Denosumab injection  What is this medicine?  DENOSUMAB (den oh sue mab) slows bone breakdown. Prolia is used to treat osteoporosis in women after menopause and in men. Xgeva is used to prevent bone fractures and other bone problems caused by cancer bone metastases. Xgeva is also used to treat giant cell tumor of the bone.  This medicine may be used for other purposes; ask your health care provider or pharmacist if you have questions.  What should I tell my health care provider before I take this medicine?  They need to know if you have any of these conditions:  -dental disease  -eczema  -infection or history of infections  -kidney disease or on dialysis  -low blood calcium or vitamin D  -malabsorption syndrome  -scheduled to have surgery or tooth extraction  -taking medicine that contains denosumab  -thyroid or parathyroid disease  -an unusual reaction to denosumab, other medicines, foods, dyes, or preservatives  -pregnant or trying to get pregnant  -breast-feeding  How should I use this medicine?  This medicine is for injection under the skin. It is given by a health care professional in a hospital or clinic setting.  If you are getting Prolia, a special MedGuide will be given to you by the pharmacist with each prescription and refill. Be sure to read this information carefully each time.  For Prolia, talk to your pediatrician regarding the use of this medicine in children. Special care may be needed. For Xgeva, talk to your pediatrician regarding the use of this medicine in children. While this drug may be prescribed for children as young as 13 years for selected conditions, precautions do apply.  Overdosage: If you think you have taken too much of this medicine contact a poison control center or emergency room at once.  NOTE: This medicine is only for you. Do not share this medicine with others.  What if I miss a dose?  It is important not to miss your dose. Call your doctor or health care professional if you are  unable to keep an appointment.  What may interact with this medicine?  Do not take this medicine with any of the following medications:  -other medicines containing denosumab  This medicine may also interact with the following medications:  -medicines that suppress the immune system  -medicines that treat cancer  -steroid medicines like prednisone or cortisone  This list may not describe all possible interactions. Give your health care provider a list of all the medicines, herbs, non-prescription drugs, or dietary supplements you use. Also tell them if you smoke, drink alcohol, or use illegal drugs. Some items may interact with your medicine.  What should I watch for while using this medicine?  Visit your doctor or health care professional for regular checks on your progress. Your doctor or health care professional may order blood tests and other tests to see how you are doing.  Call your doctor or health care professional if you get a cold or other infection while receiving this medicine. Do not treat yourself. This medicine may decrease your body's ability to fight infection.  You should make sure you get enough calcium and vitamin D while you are taking this medicine, unless your doctor tells you not to. Discuss the foods you eat and the vitamins you take with your health care professional.  See your dentist regularly. Brush and floss your teeth as directed. Before you have any dental work done, tell your dentist you are receiving this medicine.  Do   not become pregnant while taking this medicine or for 5 months after stopping it. Women should inform their doctor if they wish to become pregnant or think they might be pregnant. There is a potential for serious side effects to an unborn child. Talk to your health care professional or pharmacist for more information.  What side effects may I notice from receiving this medicine?  Side effects that you should report to your doctor or health care professional as soon as  possible:  -allergic reactions like skin rash, itching or hives, swelling of the face, lips, or tongue  -breathing problems  -chest pain  -fast, irregular heartbeat  -feeling faint or lightheaded, falls  -fever, chills, or any other sign of infection  -muscle spasms, tightening, or twitches  -numbness or tingling  -skin blisters or bumps, or is dry, peels, or red  -slow healing or unexplained pain in the mouth or jaw  -unusual bleeding or bruising  Side effects that usually do not require medical attention (Report these to your doctor or health care professional if they continue or are bothersome.):  -muscle pain  -stomach upset, gas  This list may not describe all possible side effects. Call your doctor for medical advice about side effects. You may report side effects to FDA at 1-800-FDA-1088.  Where should I keep my medicine?  This medicine is only given in a clinic, doctor's office, or other health care setting and will not be stored at home.  NOTE: This sheet is a summary. It may not cover all possible information. If you have questions about this medicine, talk to your doctor, pharmacist, or health care provider.      2016, Elsevier/Gold Standard. (2011-10-16 12:37:47)

## 2016-06-15 NOTE — Progress Notes (Signed)
CLINIC:  Survivorship   REASON FOR VISIT:  Routine follow-up for history of breast cancer.   BRIEF ONCOLOGIC HISTORY:    Breast cancer of lower-outer quadrant of left female breast ([place])   07/16/2013 Procedure    No BRCA1 or 2 PA LB2 mutation      08/14/2013 Surgery    Left breast mastectomy: Multifocal invasive ductal carcinoma 2 cm, 2 cm, 4 mm, focal DCIS, focal LCIS, margins negative, 1 node had micrometastatic disease less than 200 cells, Oncotype DX score is 15 and 6, well-differentiated, followed by reconstruction      09/15/2013 -  Anti-estrogen oral therapy    Letrozole 2.5 mg by mouth daily      03/31/2014 Surgery    Reduction mammoplasty on the right breast revealed atypical lobular hyperplasia        INTERVAL HISTORY:  Ms. Shisler presents to the [facility] today for routine follow-up for her history of breast cancer.  She is doing well today.  She is taking Letrozole and tolerating it moderately well for the most part.  She does have some mild left chest wall and left upper arm pain that's been going on for the past year.  She has also become more forgetful over the past year.  She says that it has progressively gotten worse.  She is working in a very stressful environment, and over the past few weeks her anxiety about her job has gotten much worse.  She does have some vaginal dryness and uses lubrication with intercourse.      REVIEW OF SYSTEMS:  Natoya is denies fevers, chills, fatigue, cough, shortness of breath, back pain, joint aches, chest pain, palpitations, nausea, vomiting, constipation, diarrhea, hematochezia, melena, dysuria, speech changed, blurred vision, double vision, numbness, focal weakness, ROM difficulty in her arms, or any other concerns.    Breast: Denies any new nodularity, masses, tenderness, nipple changes, or nipple discharge.    PAST MEDICAL/SURGICAL HISTORY:  Past Medical History:  Diagnosis Date  . Anemia    low iron  . Anxiety    . Cancer [facility])    left breast cancer - march, 2015  . Depression   . Diabetes mellitus without complication ([place])    type 2  . Diarrhea   . Headache    migraines  . History of hiatal hernia   . Hypertension   . PONV (postoperative nausea and vomiting)    also has difficulty waking up   Past Surgical History:  Procedure Laterality Date  . ABDOMINAL HYSTERECTOMY    . BREAST RECONSTRUCTION Left 05/21/2014   DR BOWERS  . BREAST SURGERY     breast reduction on right  . CHOLECYSTECTOMY    . COLONOSCOPY    . ESOPHAGUS SURGERY     had stomach rewrapped around esophagus  . KNEE ARTHROSCOPY Left   . LATISSIMUS FLAP TO BREAST Left 05/21/2014   Procedure: LATISSIMUS FLAP TO LEFT BREAST WITH PLACEMENT OF IMPLANT FOR RECONSTRUCTION;  Surgeon: Crissie Reese, MD;  Location: [facility];  Service: Plastics;  Laterality: Left;  Marland Kitchen MASTECTOMY    . MASTECTOMY Left 07/2013  . TONSILLECTOMY       ALLERGIES:  Allergies  Allergen Reactions  . Tape Itching and Other (See Comments)    Adhesive (plastic tape) causes itching and skin to peel     CURRENT MEDICATIONS:  Outpatient Encounter Prescriptions as of 06/15/2016  Medication Sig Note  . benazepril (LOTENSIN) 20 MG tablet Take 20 mg by mouth daily.  05/18/2014: Received from: [place]  . Calcium Carbonate-Vit D-Min (CALCIUM 1200 PO) Take by mouth daily.   . Cyanocobalamin (B-12) 5000 MCG CAPS Take by mouth daily.   . pantoprazole (PROTONIX) 40 MG tablet Take 40 mg by mouth daily.  05/18/2014: Received from: [place]  . sitaGLIPtin-metformin (JANUMET) 50-1000 MG per tablet Take 1 tablet by mouth daily.   Marland Kitchen UNABLE TO FIND 1,000 mg daily. Magnesium   . venlafaxine XR (EFFEXOR-XR) 150 MG 24 hr capsule Take 150 mg by mouth daily with breakfast.   . cyclobenzaprine (FLEXERIL) 10 MG tablet  11/19/2014: Received from: External Pharmacy  . letrozole (FEMARA) 2.5 MG tablet Take 1 tablet (2.5 mg total) by mouth daily.   . [EXPIRED] denosumab (PROLIA)  injection 60 mg     No facility-administered encounter medications on file as of 06/15/2016.      ONCOLOGIC FAMILY HISTORY:  Family History  Problem Relation Age of Onset  . Diabetes Mother   . COPD Mother   . CAD Mother   . Heart attack Father      SOCIAL HISTORY:  Roshan Salamon is married and lives with her husband in [place], [place].  She has two children.  Her daughter lives in [place], [place], and her son lives in [place].  Each of her children have two children.  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  She denies any current or history of tobacco, alcohol, or illicit drug use.     PHYSICAL EXAMINATION:  Vital Signs: Vitals:   06/15/16 1001  BP: 121/66  Pulse: 96  Resp: 18  Temp: 98.6 F (37 C)   Filed Weights   06/15/16 1001  Weight: 173 lb 9.6 oz (78.7 kg)   General: Well-nourished, well-appearing female in no acute distress.  Unaccompanied today.   HEENT: Head is normocephalic.  Pupils equal and reactive to light. Conjunctivae clear without exudate.  Sclerae anicteric. Oral mucosa is pink, moist.  Oropharynx is pink without lesions or erythema.  Lymph: No cervical, supraclavicular, or infraclavicular lymphadenopathy noted on palpation.  Cardiovascular: Regular rate and rhythm.Marland Kitchen Respiratory: Clear to auscultation bilaterally. Chest expansion symmetric; breathing non-labored.  Breast Exam:  -Left breast: No appreciable masses on palpation.s/p LD flap reconstruction, no skin changes, nodularity, or abnormality noted.  -Right breast: No appreciable masses on palpation. No skin redness, thickening, or peau d'orange appearance; no nipple retraction or nipple discharge;  -Axilla: No axillary adenopathy bilaterally.  GI: Abdomen soft and round; non-tender, non-distended. Bowel sounds normoactive. No hepatosplenomegaly.   GU: Deferred.  Neuro: No focal deficits. Steady gait.  Psych: Mood and affect normal and appropriate for situation.    Extremities: No edema. Skin: Warm and dry.  LABORATORY DATA:  No visits with results within 1 Day(s) from this visit.  Latest known visit with results is:  Appointment on 06/14/2016  Component Date Value Ref Range Status  . WBC 06/14/2016 6.4  3.9 - 10.3 10e3/uL Final  . NEUT# 06/14/2016 3.9  1.5 - 6.5 10e3/uL Final  . HGB 06/14/2016 14.1  11.6 - 15.9 g/dL Final  . HCT 06/14/2016 40.8  34.8 - 46.6 % Final  . Platelets 06/14/2016 219  145 - 400 10e3/uL Final  . MCV 06/14/2016 88.7  79.5 - 101.0 fL Final  . MCH 06/14/2016 30.7  25.1 - 34.0 pg Final  . MCHC 06/14/2016 34.5  31.5 - 36.0 g/dL Final  . RBC 06/14/2016 4.60  3.70 - 5.45 10e6/uL Final  . RDW  06/14/2016 12.9  11.2 - 14.5 % Final  . lymph# 06/14/2016 2.0  0.9 - 3.3 10e3/uL Final  . MONO# 06/14/2016 0.3  0.1 - 0.9 10e3/uL Final  . Eosinophils Absolute 06/14/2016 0.1  0.0 - 0.5 10e3/uL Final  . Basophils Absolute 06/14/2016 0.0  0.0 - 0.1 10e3/uL Final  . NEUT% 06/14/2016 61.4  38.4 - 76.8 % Final  . LYMPH% 06/14/2016 31.1  14.0 - 49.7 % Final  . MONO% 06/14/2016 5.0  0.0 - 14.0 % Final  . EOS% 06/14/2016 2.0  0.0 - 7.0 % Final  . BASO% 06/14/2016 0.5  0.0 - 2.0 % Final  . Sodium 06/14/2016 142  136 - 145 mEq/L Final  . Potassium 06/14/2016 3.9  3.5 - 5.1 mEq/L Final  . Chloride 06/14/2016 105  98 - 109 mEq/L Final  . CO2 06/14/2016 23  22 - 29 mEq/L Final  . Glucose 06/14/2016 95  70 - 140 mg/dl Final  . BUN 06/14/2016 8.6  7.0 - 26.0 mg/dL Final  . Creatinine 06/14/2016 0.7  0.6 - 1.1 mg/dL Final  . Total Bilirubin 06/14/2016 0.36  0.20 - 1.20 mg/dL Final  . Alkaline Phosphatase 06/14/2016 82  40 - 150 U/L Final  . AST 06/14/2016 31  5 - 34 U/L Final  . ALT 06/14/2016 35  0 - 55 U/L Final  . Total Protein 06/14/2016 7.4  6.4 - 8.3 g/dL Final  . Albumin 06/14/2016 4.0  3.5 - 5.0 g/dL Final  . Calcium 06/14/2016 9.7  8.4 - 10.4 mg/dL Final  . Anion Gap 06/14/2016 14* 3 - 11 mEq/L Final  . EGFR 06/14/2016 >90  >90  ml/min/1.73 m2 Final     DIAGNOSTIC IMAGING:  Most recent mammogram:      ASSESSMENT AND PLAN:  Ms.. Clavin is a pleasant 56 y.o. female with history of multifocal left breast invasive ductal carcinoma, ER+/PR+/HER2-, diagnosed in 2015 treated with mastectomy,  and anti-estrogen therapy with Letrozole beginning in May, 2015.  [facility] for [facility] and routine follow-up.   1. History of breast cancer:  Ms. Posey is currently clinically and radiographically without evidence of disease or recurrence of breast cancer. She will be due for mammogram in July, 2018; orders placed today.  She will continue her anti-estrogen therapy with Letrozole daily, with plans to continue for 5 years.  She will return to the cancer center to see her medical oncologist, Dr. Lindi Adie, in one year.  I encouraged her to call me with any questions or concerns before her next visit at the cancer center, and I would be happy to see her sooner, if needed.    2. Forgetfulness: This is likely due to her stress.  There are other things that it could be related to however, and I encouraged her to f/u with her PCP about it soon.    3.  Left chest wall pain: I offered patient an xray of the area, and she declined.  She will let me know if it worsens.    4. Bone health:  Given Ms. Bluemel's age, history of breast cancer, and her current anti-estrogen therapy with Letrozole, she is at risk for bone demineralization. Her last DEXA scan was in 2014 and demonstrated osteopenia.  She receives Prolia every 6 months.  I have ordered another DEXA for July when she is due for her mammogram.  In the meantime, she was encouraged to increase her consumption of foods rich in calcium, as well as  increase her weight-bearing activities.  She was given education on specific food and activities to promote bone health.  5. Cancer screening:  Due to Ms. Dillenbeck's history and her age, she should receive screening for skin  cancers, colon cancer, and gynecologic cancers. She was encouraged to follow-up with her PCP for appropriate cancer screenings.   6. Health maintenance and wellness promotion: Ms. Sheeran was encouraged to consume 5-7 servings of fruits and vegetables per day. She was also encouraged to engage in moderate to vigorous exercise for 30 minutes per day most days of the week. She was instructed to limit her alcohol consumption and continue to abstain from tobacco use.  I gave her information about the [place] program today.      Dispo:  -Return to cancer center in 6 months for lab, Prolia -Return to cancer center in one year, for lab, Prolia, and Dr. Lindi Adie follow up.     A total of (30) minutes of face-to-face time was spent with this patient with greater than 50% of that time in counseling and care-coordination.   Charlestine Massed, NP [facility] [place] [phone]   Note: PRIMARY CARE PROVIDER Patric Dykes, [place] [phone]

## 2016-06-18 ENCOUNTER — Telehealth: Payer: Self-pay

## 2016-06-18 NOTE — Telephone Encounter (Signed)
Spoke with patient and she is aware of her 2/15 los appts   [employer]

## 2016-11-17 ENCOUNTER — Ambulatory Visit
Admission: RE | Admit: 2016-11-17 | Discharge: 2016-11-17 | Disposition: A | Discharge status: Home / Self Care | Payer: BLUE CROSS/BLUE SHIELD | Source: Ambulatory Visit | Attending: Adult Health | Admitting: Adult Health

## 2016-11-17 DIAGNOSIS — E2839 Other primary ovarian failure: Secondary | ICD-10-CM

## 2016-11-17 DIAGNOSIS — C50512 Malignant neoplasm of lower-outer quadrant of left female breast: Secondary | ICD-10-CM

## 2016-11-17 DIAGNOSIS — Z170 Estrogen receptor positive status [ER+]: Principal | ICD-10-CM

## 2016-11-17 HISTORY — DX: Personal history of antineoplastic chemotherapy: Z92.21

## 2016-12-04 ENCOUNTER — Telehealth: Payer: Self-pay | Admitting: Emergency Medicine

## 2016-12-04 NOTE — Telephone Encounter (Signed)
Called patient per Katherine Haring NP request. Reviewed bone density scan with patient. Patient has osteopenia and Katherine Acosta recommends Calcium, Vit D and Weight bearing exercises. Repeat scan in 2 years. Patient verbalized her understanding.

## 2016-12-04 NOTE — Telephone Encounter (Signed)
Called patient and left a VM. Awaiting return call regarding bone density scan.

## 2016-12-13 ENCOUNTER — Ambulatory Visit: Payer: BLUE CROSS/BLUE SHIELD

## 2016-12-13 ENCOUNTER — Other Ambulatory Visit: Payer: Self-pay

## 2016-12-13 ENCOUNTER — Other Ambulatory Visit: Payer: BLUE CROSS/BLUE SHIELD

## 2016-12-13 DIAGNOSIS — C50912 Malignant neoplasm of unspecified site of left female breast: Secondary | ICD-10-CM

## 2016-12-14 ENCOUNTER — Encounter (HOSPITAL_COMMUNITY): Payer: Self-pay | Admitting: *Deleted

## 2016-12-14 ENCOUNTER — Emergency Department (HOSPITAL_COMMUNITY): Payer: BLUE CROSS/BLUE SHIELD

## 2016-12-14 ENCOUNTER — Other Ambulatory Visit (HOSPITAL_BASED_OUTPATIENT_CLINIC_OR_DEPARTMENT_OTHER): Payer: BLUE CROSS/BLUE SHIELD

## 2016-12-14 ENCOUNTER — Emergency Department (HOSPITAL_COMMUNITY)
Admission: EM | Admit: 2016-12-14 | Discharge: 2016-12-14 | Disposition: A | Discharge status: Home / Self Care | Payer: BLUE CROSS/BLUE SHIELD | Attending: Emergency Medicine | Admitting: Emergency Medicine

## 2016-12-14 ENCOUNTER — Ambulatory Visit (HOSPITAL_BASED_OUTPATIENT_CLINIC_OR_DEPARTMENT_OTHER): Payer: BLUE CROSS/BLUE SHIELD

## 2016-12-14 VITALS — BP 137/75 | HR 84 | Temp 98.5°F | Resp 20

## 2016-12-14 DIAGNOSIS — I10 Essential (primary) hypertension: Secondary | ICD-10-CM | POA: Diagnosis not present

## 2016-12-14 DIAGNOSIS — Z79899 Other long term (current) drug therapy: Secondary | ICD-10-CM | POA: Diagnosis not present

## 2016-12-14 DIAGNOSIS — Z79811 Long term (current) use of aromatase inhibitors: Secondary | ICD-10-CM | POA: Diagnosis not present

## 2016-12-14 DIAGNOSIS — Z853 Personal history of malignant neoplasm of breast: Secondary | ICD-10-CM

## 2016-12-14 DIAGNOSIS — R51 Headache: Secondary | ICD-10-CM | POA: Diagnosis not present

## 2016-12-14 DIAGNOSIS — C50912 Malignant neoplasm of unspecified site of left female breast: Secondary | ICD-10-CM

## 2016-12-14 DIAGNOSIS — E119 Type 2 diabetes mellitus without complications: Secondary | ICD-10-CM | POA: Insufficient documentation

## 2016-12-14 DIAGNOSIS — R109 Unspecified abdominal pain: Secondary | ICD-10-CM | POA: Diagnosis not present

## 2016-12-14 DIAGNOSIS — M940 Chondrocostal junction syndrome [Tietze]: Secondary | ICD-10-CM

## 2016-12-14 DIAGNOSIS — C50512 Malignant neoplasm of lower-outer quadrant of left female breast: Secondary | ICD-10-CM

## 2016-12-14 DIAGNOSIS — M542 Cervicalgia: Secondary | ICD-10-CM | POA: Diagnosis present

## 2016-12-14 LAB — COMPREHENSIVE METABOLIC PANEL
ALBUMIN: 3.7 g/dL (ref 3.5–5.0)
ALK PHOS: 82 U/L (ref 40–150)
ALT: 41 U/L (ref 0–55)
ANION GAP: 11 meq/L (ref 3–11)
AST: 69 U/L — ABNORMAL HIGH (ref 5–34)
BILIRUBIN TOTAL: 0.53 mg/dL (ref 0.20–1.20)
BUN: 6.4 mg/dL — AB (ref 7.0–26.0)
CALCIUM: 9.5 mg/dL (ref 8.4–10.4)
CO2: 25 mEq/L (ref 22–29)
Chloride: 105 mEq/L (ref 98–109)
Creatinine: 0.7 mg/dL (ref 0.6–1.1)
GLUCOSE: 165 mg/dL — AB (ref 70–140)
Potassium: 3.7 mEq/L (ref 3.5–5.1)
Sodium: 142 mEq/L (ref 136–145)
TOTAL PROTEIN: 6.8 g/dL (ref 6.4–8.3)

## 2016-12-14 LAB — CBC WITH DIFFERENTIAL/PLATELET
BASO%: 0.2 % (ref 0.0–2.0)
BASOS ABS: 0 10*3/uL (ref 0.0–0.1)
Basophils Absolute: 0 10*3/uL (ref 0.0–0.1)
Basophils Relative: 0 %
EOS ABS: 0.1 10*3/uL (ref 0.0–0.5)
EOS ABS: 0.1 10*3/uL (ref 0.0–0.7)
EOS%: 2.7 % (ref 0.0–7.0)
Eosinophils Relative: 2 %
HEMATOCRIT: 35.1 % (ref 34.8–46.6)
HEMATOCRIT: 34.6 % — AB (ref 36.0–46.0)
HGB: 11.9 g/dL (ref 11.6–15.9)
Hemoglobin: 11.8 g/dL — ABNORMAL LOW (ref 12.0–15.0)
LYMPH#: 1.4 10*3/uL (ref 0.9–3.3)
LYMPH%: 33.4 % (ref 14.0–49.7)
LYMPHS ABS: 1.6 10*3/uL (ref 0.7–4.0)
Lymphocytes Relative: 29 %
MCH: 30.1 pg (ref 25.1–34.0)
MCH: 29.3 pg (ref 26.0–34.0)
MCHC: 33.9 g/dL (ref 31.5–36.0)
MCHC: 34.1 g/dL (ref 30.0–36.0)
MCV: 88.6 fL (ref 79.5–101.0)
MCV: 85.9 fL (ref 78.0–100.0)
MONO#: 0.2 10*3/uL (ref 0.1–0.9)
MONO%: 5.7 % (ref 0.0–14.0)
Monocytes Absolute: 0.3 10*3/uL (ref 0.1–1.0)
Monocytes Relative: 5 %
NEUT%: 58 % (ref 38.4–76.8)
NEUTROS ABS: 2.3 10*3/uL (ref 1.5–6.5)
NEUTROS ABS: 3.6 10*3/uL (ref 1.7–7.7)
Neutrophils Relative %: 64 %
PLATELETS: 172 10*3/uL (ref 145–400)
Platelets: 203 10*3/uL (ref 150–400)
RBC: 3.96 10*6/uL (ref 3.70–5.45)
RBC: 4.03 MIL/uL (ref 3.87–5.11)
RDW: 12.9 % (ref 11.2–14.5)
RDW: 12.8 % (ref 11.5–15.5)
WBC: 4 10*3/uL (ref 3.9–10.3)
WBC: 5.6 10*3/uL (ref 4.0–10.5)

## 2016-12-14 LAB — I-STAT CHEM 8, ED
BUN: 4 mg/dL — ABNORMAL LOW (ref 6–20)
CALCIUM ION: 1.13 mmol/L — AB (ref 1.15–1.40)
CREATININE: 0.5 mg/dL (ref 0.44–1.00)
Chloride: 105 mmol/L (ref 101–111)
Glucose, Bld: 95 mg/dL (ref 65–99)
HEMATOCRIT: 35 % — AB (ref 36.0–46.0)
HEMOGLOBIN: 11.9 g/dL — AB (ref 12.0–15.0)
Potassium: 3.6 mmol/L (ref 3.5–5.1)
SODIUM: 142 mmol/L (ref 135–145)
TCO2: 25 mmol/L (ref 0–100)

## 2016-12-14 MED ORDER — IOPAMIDOL (ISOVUE-300) INJECTION 61%
INTRAVENOUS | Status: AC
  Administered 2016-12-14: 100 mL
  Filled 2016-12-14: qty 100

## 2016-12-14 MED ORDER — IBUPROFEN 600 MG PO TABS: 600.0000 mg | ORAL_TABLET | Freq: Four times a day (QID) | ORAL | 0 refills | Status: DC | PRN

## 2016-12-14 MED ORDER — CYCLOBENZAPRINE HCL 10 MG PO TABS: 10.0000 mg | ORAL_TABLET | Freq: Two times a day (BID) | ORAL | 0 refills | Status: DC | PRN

## 2016-12-14 MED ORDER — MORPHINE SULFATE (PF) 4 MG/ML IV SOLN
4.0000 mg | Freq: Once | INTRAVENOUS | Status: AC
  Administered 2016-12-14: 4 mg via INTRAVENOUS
  Filled 2016-12-14: qty 1

## 2016-12-14 MED ORDER — DENOSUMAB 60 MG/ML ~~LOC~~ SOLN
60.0000 mg | Freq: Once | SUBCUTANEOUS | Status: AC
  Administered 2016-12-14: 60 mg via SUBCUTANEOUS
  Filled 2016-12-14: qty 1

## 2016-12-14 NOTE — Patient Instructions (Signed)
Denosumab injection  What is this medicine?  DENOSUMAB (den oh sue mab) slows bone breakdown. Prolia is used to treat osteoporosis in women after menopause and in men. Xgeva is used to prevent bone fractures and other bone problems caused by cancer bone metastases. Xgeva is also used to treat giant cell tumor of the bone.  This medicine may be used for other purposes; ask your health care provider or pharmacist if you have questions.  What should I tell my health care provider before I take this medicine?  They need to know if you have any of these conditions:  -dental disease  -eczema  -infection or history of infections  -kidney disease or on dialysis  -low blood calcium or vitamin D  -malabsorption syndrome  -scheduled to have surgery or tooth extraction  -taking medicine that contains denosumab  -thyroid or parathyroid disease  -an unusual reaction to denosumab, other medicines, foods, dyes, or preservatives  -pregnant or trying to get pregnant  -breast-feeding  How should I use this medicine?  This medicine is for injection under the skin. It is given by a health care professional in a hospital or clinic setting.  If you are getting Prolia, a special MedGuide will be given to you by the pharmacist with each prescription and refill. Be sure to read this information carefully each time.  For Prolia, talk to your pediatrician regarding the use of this medicine in children. Special care may be needed. For Xgeva, talk to your pediatrician regarding the use of this medicine in children. While this drug may be prescribed for children as young as 13 years for selected conditions, precautions do apply.  Overdosage: If you think you have taken too much of this medicine contact a poison control center or emergency room at once.  NOTE: This medicine is only for you. Do not share this medicine with others.  What if I miss a dose?  It is important not to miss your dose. Call your doctor or health care professional if you are  unable to keep an appointment.  What may interact with this medicine?  Do not take this medicine with any of the following medications:  -other medicines containing denosumab  This medicine may also interact with the following medications:  -medicines that suppress the immune system  -medicines that treat cancer  -steroid medicines like prednisone or cortisone  This list may not describe all possible interactions. Give your health care provider a list of all the medicines, herbs, non-prescription drugs, or dietary supplements you use. Also tell them if you smoke, drink alcohol, or use illegal drugs. Some items may interact with your medicine.  What should I watch for while using this medicine?  Visit your doctor or health care professional for regular checks on your progress. Your doctor or health care professional may order blood tests and other tests to see how you are doing.  Call your doctor or health care professional if you get a cold or other infection while receiving this medicine. Do not treat yourself. This medicine may decrease your body's ability to fight infection.  You should make sure you get enough calcium and vitamin D while you are taking this medicine, unless your doctor tells you not to. Discuss the foods you eat and the vitamins you take with your health care professional.  See your dentist regularly. Brush and floss your teeth as directed. Before you have any dental work done, tell your dentist you are receiving this medicine.  Do   not become pregnant while taking this medicine or for 5 months after stopping it. Women should inform their doctor if they wish to become pregnant or think they might be pregnant. There is a potential for serious side effects to an unborn child. Talk to your health care professional or pharmacist for more information.  What side effects may I notice from receiving this medicine?  Side effects that you should report to your doctor or health care professional as soon as  possible:  -allergic reactions like skin rash, itching or hives, swelling of the face, lips, or tongue  -breathing problems  -chest pain  -fast, irregular heartbeat  -feeling faint or lightheaded, falls  -fever, chills, or any other sign of infection  -muscle spasms, tightening, or twitches  -numbness or tingling  -skin blisters or bumps, or is dry, peels, or red  -slow healing or unexplained pain in the mouth or jaw  -unusual bleeding or bruising  Side effects that usually do not require medical attention (Report these to your doctor or health care professional if they continue or are bothersome.):  -muscle pain  -stomach upset, gas  This list may not describe all possible side effects. Call your doctor for medical advice about side effects. You may report side effects to FDA at 1-800-FDA-1088.  Where should I keep my medicine?  This medicine is only given in a clinic, doctor's office, or other health care setting and will not be stored at home.  NOTE: This sheet is a summary. It may not cover all possible information. If you have questions about this medicine, talk to your doctor, pharmacist, or health care provider.      2016, Elsevier/Gold Standard. (2011-10-16 12:37:47)
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
## 2016-12-14 NOTE — ED Notes (Signed)
Pt ambulatory to bathroom with steady gait.

## 2017-05-10 ENCOUNTER — Other Ambulatory Visit: Payer: Self-pay

## 2017-05-10 ENCOUNTER — Telehealth: Payer: Self-pay

## 2017-05-10 DIAGNOSIS — C50512 Malignant neoplasm of lower-outer quadrant of left female breast: Secondary | ICD-10-CM

## 2017-05-10 MED ORDER — LETROZOLE 2.5 MG PO TABS: 2.5000 mg | ORAL_TABLET | Freq: Every day | ORAL | 0 refills | Status: DC

## 2017-05-10 NOTE — Telephone Encounter (Signed)
Received VM from patient regarding a refill for her Letrozole. Returned TC to pt to let her know that the refill was placed by e-scribe to her pharmacy this afternoon. Confirmed she has an appt coming up in February 2019. Pt voiced understanding and had no other needs at this time.

## 2017-05-30 ENCOUNTER — Telehealth: Payer: Self-pay | Admitting: Hematology and Oncology

## 2017-05-30 NOTE — Telephone Encounter (Signed)
Returned patients call. Left message for patient to call back concerning scheduling issues.

## 2017-06-13 ENCOUNTER — Other Ambulatory Visit: Payer: Self-pay

## 2017-06-13 DIAGNOSIS — C50512 Malignant neoplasm of lower-outer quadrant of left female breast: Secondary | ICD-10-CM

## 2017-06-14 ENCOUNTER — Inpatient Hospital Stay (HOSPITAL_BASED_OUTPATIENT_CLINIC_OR_DEPARTMENT_OTHER): Payer: BLUE CROSS/BLUE SHIELD | Admitting: Hematology and Oncology

## 2017-06-14 ENCOUNTER — Telehealth: Payer: Self-pay | Admitting: Hematology and Oncology

## 2017-06-14 ENCOUNTER — Inpatient Hospital Stay: Payer: BLUE CROSS/BLUE SHIELD | Attending: Hematology and Oncology

## 2017-06-14 ENCOUNTER — Inpatient Hospital Stay: Payer: BLUE CROSS/BLUE SHIELD

## 2017-06-14 VITALS — BP 136/91 | HR 86 | Temp 98.0°F | Resp 16 | Ht 62.0 in | Wt 175.5 lb

## 2017-06-14 DIAGNOSIS — R232 Flushing: Secondary | ICD-10-CM | POA: Insufficient documentation

## 2017-06-14 DIAGNOSIS — G4700 Insomnia, unspecified: Secondary | ICD-10-CM | POA: Insufficient documentation

## 2017-06-14 DIAGNOSIS — F0390 Unspecified dementia without behavioral disturbance: Secondary | ICD-10-CM | POA: Insufficient documentation

## 2017-06-14 DIAGNOSIS — Z79899 Other long term (current) drug therapy: Secondary | ICD-10-CM | POA: Insufficient documentation

## 2017-06-14 DIAGNOSIS — R531 Weakness: Secondary | ICD-10-CM

## 2017-06-14 DIAGNOSIS — R5383 Other fatigue: Secondary | ICD-10-CM | POA: Insufficient documentation

## 2017-06-14 DIAGNOSIS — Z7984 Long term (current) use of oral hypoglycemic drugs: Secondary | ICD-10-CM | POA: Insufficient documentation

## 2017-06-14 DIAGNOSIS — Z170 Estrogen receptor positive status [ER+]: Principal | ICD-10-CM

## 2017-06-14 DIAGNOSIS — C50512 Malignant neoplasm of lower-outer quadrant of left female breast: Secondary | ICD-10-CM

## 2017-06-14 DIAGNOSIS — Z79811 Long term (current) use of aromatase inhibitors: Secondary | ICD-10-CM | POA: Diagnosis not present

## 2017-06-14 DIAGNOSIS — Z9012 Acquired absence of left breast and nipple: Secondary | ICD-10-CM | POA: Insufficient documentation

## 2017-06-14 DIAGNOSIS — R29898 Other symptoms and signs involving the musculoskeletal system: Secondary | ICD-10-CM

## 2017-06-14 LAB — CMP (CANCER CENTER ONLY)
ALT: 30 U/L (ref 0–55)
AST: 52 U/L — ABNORMAL HIGH (ref 5–34)
Albumin: 3.8 g/dL (ref 3.5–5.0)
Alkaline Phosphatase: 88 U/L (ref 40–150)
Anion gap: 11 (ref 3–11)
BILIRUBIN TOTAL: 0.3 mg/dL (ref 0.2–1.2)
BUN: 10 mg/dL (ref 7–26)
CHLORIDE: 105 mmol/L (ref 98–109)
CO2: 24 mmol/L (ref 22–29)
Calcium: 9.1 mg/dL (ref 8.4–10.4)
Creatinine: 0.75 mg/dL (ref 0.60–1.10)
Glucose, Bld: 153 mg/dL — ABNORMAL HIGH (ref 70–140)
POTASSIUM: 3.7 mmol/L (ref 3.5–5.1)
Sodium: 140 mmol/L (ref 136–145)
TOTAL PROTEIN: 7.1 g/dL (ref 6.4–8.3)

## 2017-06-14 LAB — CBC WITH DIFFERENTIAL (CANCER CENTER ONLY)
Basophils Absolute: 0 10*3/uL (ref 0.0–0.1)
Basophils Relative: 1 %
EOS PCT: 2 %
Eosinophils Absolute: 0.1 10*3/uL (ref 0.0–0.5)
HEMATOCRIT: 34.9 % (ref 34.8–46.6)
Hemoglobin: 11.8 g/dL (ref 11.6–15.9)
LYMPHS ABS: 2.2 10*3/uL (ref 0.9–3.3)
LYMPHS PCT: 35 %
MCH: 29.5 pg (ref 25.1–34.0)
MCHC: 33.9 g/dL (ref 31.5–36.0)
MCV: 86.9 fL (ref 79.5–101.0)
MONO ABS: 0.3 10*3/uL (ref 0.1–0.9)
MONOS PCT: 5 %
Neutro Abs: 3.6 10*3/uL (ref 1.5–6.5)
Neutrophils Relative %: 57 %
PLATELETS: 195 10*3/uL (ref 145–400)
RBC: 4.01 MIL/uL (ref 3.70–5.45)
RDW: 13.9 % (ref 11.2–14.5)
WBC Count: 6.4 10*3/uL (ref 3.9–10.3)

## 2017-06-14 MED ORDER — SODIUM CHLORIDE 0.9 % IV SOLN: Freq: Once | INTRAVENOUS | Status: DC

## 2017-06-14 MED ORDER — ALBUTEROL SULFATE (2.5 MG/3ML) 0.083% IN NEBU
2.5000 mg | INHALATION_SOLUTION | Freq: Once | RESPIRATORY_TRACT | Status: DC | PRN
  Filled 2017-06-14: qty 3

## 2017-06-14 MED ORDER — DENOSUMAB 60 MG/ML ~~LOC~~ SOLN
60.0000 mg | Freq: Once | SUBCUTANEOUS | Status: AC
  Administered 2017-06-14: 60 mg via SUBCUTANEOUS

## 2017-06-14 MED ORDER — METHYLPREDNISOLONE SODIUM SUCC 125 MG IJ SOLR: 125.0000 mg | Freq: Once | INTRAMUSCULAR | Status: DC | PRN

## 2017-06-14 MED ORDER — DIPHENHYDRAMINE HCL 50 MG/ML IJ SOLN: 25.0000 mg | Freq: Once | INTRAMUSCULAR | Status: DC | PRN

## 2017-06-14 MED ORDER — SODIUM CHLORIDE 0.9 % IV SOLN: Freq: Once | INTRAVENOUS | Status: DC | PRN

## 2017-06-14 MED ORDER — LETROZOLE 2.5 MG PO TABS: 2.5000 mg | ORAL_TABLET | Freq: Every day | ORAL | 3 refills | Status: DC

## 2017-06-14 MED ORDER — DIPHENHYDRAMINE HCL 50 MG/ML IJ SOLN: 50.0000 mg | Freq: Once | INTRAMUSCULAR | Status: DC | PRN

## 2017-06-14 NOTE — Patient Instructions (Signed)

## 2017-06-14 NOTE — Progress Notes (Signed)
Patient Care Team: Patric Dykes, MD as PCP - General (Family Medicine)  DIAGNOSIS:  Encounter Diagnoses  Name Primary?  . Dementia without behavioral disturbance, unspecified dementia type Yes  . Right arm weakness   . Malignant neoplasm of lower-outer quadrant of left female breast, unspecified estrogen receptor status ([place])     SUMMARY OF ONCOLOGIC HISTORY:   Breast cancer of lower-outer quadrant of left female breast ([place])   07/16/2013 Procedure    No BRCA1 or 2 PA LB2 mutation      08/14/2013 Surgery    Left breast mastectomy: Multifocal invasive ductal carcinoma 2 cm, 2 cm, 4 mm, focal DCIS, focal LCIS, margins negative, 1 node had micrometastatic disease less than 200 cells, Oncotype DX score is 15 and 6, well-differentiated, followed by reconstruction      09/15/2013 -  Anti-estrogen oral therapy    Letrozole 2.5 mg by mouth daily      03/31/2014 Surgery    Reduction mammoplasty on the right breast revealed atypical lobular hyperplasia       CHIEF COMPLIANT: rt arm weakness, severe fatigue, memory loss  INTERVAL HISTORY: Katherine Acosta is a 57 yr old with h/o left breast cancer who is currently on letrozole. She complains of Rt arm weakness as well as profound memory loss recently. Shes also severely fatigued. C/O pain in the left axilla  REVIEW OF SYSTEMS:   Constitutional: Denies fevers, chills or abnormal weight loss Eyes: Denies blurriness of vision Ears, nose, mouth, throat, and face: Denies mucositis or sore throat Respiratory: Denies cough, dyspnea or wheezes Cardiovascular: Denies palpitation, chest discomfort Gastrointestinal:  Denies nausea, heartburn or change in bowel habits Skin: Denies abnormal skin rashes Lymphatics: Denies new lymphadenopathy or easy bruising Neurological:Rt arm weakness Behavioral/Psych: Mood is stable, no new changes  Extremities: No lower extremity edema Breast: Lt axilla pain All other systems were reviewed with the patient  and are negative.  I have reviewed the past medical history, past surgical history, social history and family history with the patient and they are unchanged from previous note.  ALLERGIES:  is allergic to tape.  MEDICATIONS:  Current Outpatient Medications  Medication Sig Dispense Refill  . benazepril (LOTENSIN) 20 MG tablet Take 20 mg by mouth daily.     . Calcium Carbonate-Vit D-Min (CALCIUM 1200 PO) Take by mouth daily.    . Cyanocobalamin (B-12) 5000 MCG CAPS Take by mouth daily.    . cyclobenzaprine (FLEXERIL) 10 MG tablet Take 1 tablet (10 mg total) by mouth 2 (two) times daily as needed for muscle spasms. 20 tablet 0  . ibuprofen (ADVIL,MOTRIN) 600 MG tablet Take 1 tablet (600 mg total) by mouth every 6 (six) hours as needed. 30 tablet 0  . letrozole (FEMARA) 2.5 MG tablet Take 1 tablet (2.5 mg total) by mouth daily. 90 tablet 3  . pantoprazole (PROTONIX) 40 MG tablet Take 40 mg by mouth daily.     . sitaGLIPtin-metformin (JANUMET) 50-1000 MG per tablet Take 1 tablet by mouth daily.    Marland Kitchen UNABLE TO FIND 1,000 mg daily. Magnesium    . venlafaxine XR (EFFEXOR-XR) 150 MG 24 hr capsule Take 150 mg by mouth daily with breakfast.     No current facility-administered medications for this visit.     PHYSICAL EXAMINATION: ECOG PERFORMANCE STATUS: 1 - Symptomatic but completely ambulatory  Vitals:   06/14/17 1449  BP: (!) 136/91  Pulse: 86  Resp: 16  Temp: 98 F (36.7 C)  SpO2: 98%  Filed Weights   06/14/17 1449  Weight: 175 lb 8 oz (79.6 kg)    GENERAL:alert, no distress and comfortable SKIN: skin color, texture, turgor are normal, no rashes or significant lesions EYES: normal, Conjunctiva are pink and non-injected, sclera clear OROPHARYNX:no exudate, no erythema and lips, buccal mucosa, and tongue normal  NECK: supple, thyroid normal size, non-tender, without nodularity LYMPH:  no palpable lymphadenopathy in the cervical, axillary or inguinal LUNGS: clear to auscultation  and percussion with normal breathing effort HEART: regular rate & rhythm and no murmurs and no lower extremity edema ABDOMEN:abdomen soft, non-tender and normal bowel sounds MUSCULOSKELETAL:no cyanosis of digits and no clubbing  NEURO: alert & oriented x 3 with fluent speech, no focal motor/sensory deficits EXTREMITIES: No lower extremity edema BREAST: No palpable masses or nodules in either right or left breasts. No palpable axillary supraclavicular or infraclavicular adenopathy no breast tenderness or nipple discharge. (exam performed in the presence of a chaperone)  LABORATORY DATA:  I have reviewed the data as listed CMP Latest Ref Rng & Units 06/14/2017 12/14/2016 12/14/2016  Glucose 70 - 140 mg/dL 153(H) 95 165(H)  BUN 7 - 26 mg/dL 10 4(L) 6.4(L)  Creatinine 0.60 - 1.10 mg/dL 0.75 0.50 0.7  Sodium 136 - 145 mmol/L 140 142 142  Potassium 3.5 - 5.1 mmol/L 3.7 3.6 3.7  Chloride 98 - 109 mmol/L 105 105 -  CO2 22 - 29 mmol/L 24 25 -  Calcium 8.4 - 10.4 mg/dL 9.1 9.5 -  Total Protein 6.4 - 8.3 g/dL 7.1 6.8 -  Total Bilirubin 0.2 - 1.2 mg/dL 0.3 0.53 -  Alkaline Phos 40 - 150 U/L 88 82 -  AST 5 - 34 U/L 52(H) 69(H) -  ALT 0 - 55 U/L 30 41 -    Lab Results  Component Value Date   WBC 6.4 06/14/2017   HGB 11.9 (L) 12/14/2016   HCT 34.9 06/14/2017   MCV 86.9 06/14/2017   PLT 195 06/14/2017   NEUTROABS 3.6 06/14/2017    ASSESSMENT & PLAN:  Breast cancer of lower-outer quadrant of left female breast Left breast invasive ductal carcinoma multifocal disease, 2 cm, 2 cm, 4 mm, T1 cN0 M0 stage IA 3 SLN negative but it showed IHC positive for micrometastatic disease less than 200 cells accompanied by extensive DCIS grade 2 margins negative, no lymphovascular invasion or dermal invasion, Oncotype DX recurrence score 15 (9% ROR) and 6 (5% ROR) on each of the tumors with third tumor could not be done.  Current treatment: Letrozole started May 2015 Letrozole toxicities: 1. Hot flashes 2.  Difficulty falling asleep at bedtime 3. Fatigue   4.  Memory problems Rt Arm weakness: Refer to neurology  Soreness in the left axilla: Related to scar tissue  Sent a message to Dr.Bowers to evaluate her implant related pain   Right breast atypical lobular hyperplasia detected during reduction mammoplasty surgery Rt Mammogram 11/20/2016: Negative MRI 11/13/2014: No MRI evidence of malignancy CT abdomen pelvis 12/14/2016: Benign  Breast Cancer Surveillance: 1. Breast exam 06/14/2017: Scar tissue left axilla, Left mastectomy with implant 2. Mammogram patient will need a mammogram on the right breast in March of every year.   Return to clinic in 1 year for follow-up   I spent 25 minutes talking to the patient of which more than half was spent in counseling and coordination of care.  Orders Placed This Encounter  Procedures  . Ambulatory referral to Neurology    Referral Priority:   Routine  Referral Type:   Consultation    Referral Reason:   Specialty Services Required    Requested Specialty:   Neurology    Number of Visits Requested:   1   The patient has a good understanding of the overall plan. she agrees with it. she will call with any problems that may develop before the next visit here.   Harriette Ohara, MD 06/14/17

## 2017-06-14 NOTE — Telephone Encounter (Signed)
Gave patient AVS and calendar of upcoming February 2020 appointments.  °

## 2017-06-14 NOTE — Assessment & Plan Note (Signed)
Left breast invasive ductal carcinoma multifocal disease, 2 cm, 2 cm, 4 mm, T1 cN0 M0 stage IA 3 SLN negative but it showed IHC positive for micrometastatic disease less than 200 cells accompanied by extensive DCIS grade 2 margins negative, no lymphovascular invasion or dermal invasion, Oncotype DX recurrence score 15 (9% ROR) and 6 (5% ROR) on each of the tumors with third tumor could not be done.  Current treatment: Letrozole started May 2015 Letrozole toxicities: 1. Hot flashes 2. Difficulty falling asleep at bedtime 3. Fatigue   4.  Memory problems  Soreness in the left axilla: Related to scar tissue    Right breast atypical lobular hyperplasia detected during reduction mammoplasty surgery Rt Mammogram 11/20/2016: Negative MRI 11/13/2014: No MRI evidence of malignancy CT abdomen pelvis 12/14/2016: Benign  Breast Cancer Surveillance: 1. Breast exam 06/14/2017: Scar tissue left axilla 2. Mammogram patient will need a mammogram on the right breast in March of every year.   Return to clinic in 1 year for follow-up

## 2017-06-20 ENCOUNTER — Telehealth: Payer: Self-pay

## 2017-06-20 NOTE — Telephone Encounter (Signed)
Returned pt call regarding discontinuing letrozole. She stopped taking letrozole on 06/14/17 due to tiredness and has starting feeling a severe increase in emotions as well as hot flashes. Patient is already on Effexor and is taking it as prescribed. Per Dr. Lindi Adie we suggested she try to start taking the letrozole again at 1/2 tablet. She is to call us if symptoms do not improve.  Cyndia Bent RN

## 2017-07-16 ENCOUNTER — Other Ambulatory Visit: Payer: Self-pay

## 2017-07-16 MED ORDER — ONDANSETRON 8 MG PO TBDP: 8.0000 mg | ORAL_TABLET | Freq: Three times a day (TID) | ORAL | 0 refills | Status: DC | PRN

## 2017-07-16 NOTE — Telephone Encounter (Signed)
Patient called stating she has been off of her letrozole for a week and has had multiple bouts of nausea and vomiting. Explained to patient that it typically takes three weeks for the body to adjust. Informed her we could send in a nausea medication to help her get through the next two weeks. She is to call with any further concerns. If doing well she is to call in two weeks to give [place] an update.  Cyndia Bent RN

## 2017-08-07 ENCOUNTER — Telehealth: Payer: Self-pay

## 2017-08-07 NOTE — Telephone Encounter (Signed)
error 

## 2017-08-07 NOTE — Telephone Encounter (Signed)
Pt calling to let Dr.Gudena know that she had been off of her letrozole for several weeks now and her symptoms have improved. She is 1 yr from completing AE therapy, but unable to continue with letrozole d/t multiple severe side effects. Told pt about switching to anastrozole, but pt would like to sit down with Dr.Gudena in person to discuss about additional testing to see if she is okay to not continue with therapy and if it is safe to do so. Scheduled pt, as requested, for an appt with Dr.Gudena. Confirmed time/date. No further questions at this time.

## 2017-08-14 ENCOUNTER — Inpatient Hospital Stay: Payer: BLUE CROSS/BLUE SHIELD | Admitting: Hematology and Oncology

## 2017-08-14 NOTE — Assessment & Plan Note (Signed)
Left breast invasive ductal carcinoma multifocal disease, 2 cm, 2 cm, 4 mm, T1 cN0 M0 stage IA 3 SLN negative but it showed IHC positive for micrometastatic disease less than 200 cells accompanied by extensive DCIS grade 2 margins negative, no lymphovascular invasion or dermal invasion, Oncotype DX recurrence score 15 (9% ROR) and 6 (5% ROR) on each of the tumors with third tumor could not be done.  Current treatment: Letrozole started May 2015 Letrozole toxicities: 1. Hot flashes 2. Difficulty falling asleep at bedtime 3. Fatigue  4.  Memory problems  Right breast atypical lobular hyperplasiadetected during reduction mammoplasty surgery Rt Mammogram 11/20/2016: Negative MRI 11/13/2014: No MRI evidence of malignancy CT abdomen pelvis 12/14/2016: Benign  Breast Cancer Surveillance: 1. Breast exam 06/14/2017: Scar tissue left axilla, Left mastectomy with implant 2. Mammogram 11/17/2016: No mammographic evidence of malignancy 3.  Bone density: T score -1.8: Osteopenia  Return to clinic in 1 year for follow-up

## 2017-08-14 NOTE — Progress Notes (Unsigned)
Patient Care Team: Patric Dykes, MD as PCP - General (Family Medicine)  DIAGNOSIS:  Encounter Diagnosis  Name Primary?  . Malignant neoplasm of lower-outer quadrant of left breast of female, estrogen receptor positive ([place])     SUMMARY OF ONCOLOGIC HISTORY:   Breast cancer of lower-outer quadrant of left female breast ([place])   07/16/2013 Procedure    No BRCA1 or 2 PA LB2 mutation      08/14/2013 Surgery    Left breast mastectomy: Multifocal invasive ductal carcinoma 2 cm, 2 cm, 4 mm, focal DCIS, focal LCIS, margins negative, 1 node had micrometastatic disease less than 200 cells, Oncotype DX score is 15 and 6, well-differentiated, followed by reconstruction      09/15/2013 -  Anti-estrogen oral therapy    Letrozole 2.5 mg by mouth daily      03/31/2014 Surgery    Reduction mammoplasty on the right breast revealed atypical lobular hyperplasia       CHIEF COMPLIANT: Follow-up on letrozole therapy  INTERVAL HISTORY: Katherine Acosta is a 57 year old with above-mentioned history left breast cancer treated with mastectomy and is currently on letrozole therapy.  She appears to be tolerating letrozole moderately well.  She does have hot flashes and has difficulty sleeping at night which she attributes to the letrozole therapy.  Denies any major arthralgias or myalgias.  She does have mild to moderate hot flashes.  Denies any lumps or nodules in the breast.  REVIEW OF SYSTEMS:   Constitutional: Denies fevers, chills or abnormal weight loss Eyes: Denies blurriness of vision Ears, nose, mouth, throat, and face: Denies mucositis or sore throat Respiratory: Denies cough, dyspnea or wheezes Cardiovascular: Denies palpitation, chest discomfort Gastrointestinal:  Denies nausea, heartburn or change in bowel habits Skin: Denies abnormal skin rashes Lymphatics: Denies new lymphadenopathy or easy bruising Neurological:Denies numbness, tingling or new weaknesses Behavioral/Psych: Mood is stable,  no new changes  Extremities: No lower extremity edema Breast:  denies any pain or lumps or nodules in either breasts All other systems were reviewed with the patient and are negative.  I have reviewed the past medical history, past surgical history, social history and family history with the patient and they are unchanged from previous note.  ALLERGIES:  is allergic to tape.  MEDICATIONS:  Current Outpatient Medications  Medication Sig Dispense Refill  . benazepril (LOTENSIN) 20 MG tablet Take 20 mg by mouth daily.     . Calcium Carbonate-Vit D-Min (CALCIUM 1200 PO) Take by mouth daily.    . Cyanocobalamin (B-12) 5000 MCG CAPS Take by mouth daily.    . cyclobenzaprine (FLEXERIL) 10 MG tablet Take 1 tablet (10 mg total) by mouth 2 (two) times daily as needed for muscle spasms. 20 tablet 0  . ibuprofen (ADVIL,MOTRIN) 600 MG tablet Take 1 tablet (600 mg total) by mouth every 6 (six) hours as needed. 30 tablet 0  . letrozole (FEMARA) 2.5 MG tablet Take 1 tablet (2.5 mg total) by mouth daily. 90 tablet 3  . ondansetron (ZOFRAN ODT) 8 MG disintegrating tablet Take 1 tablet (8 mg total) by mouth every 8 (eight) hours as needed for nausea or vomiting. 42 tablet 0  . pantoprazole (PROTONIX) 40 MG tablet Take 40 mg by mouth daily.     . sitaGLIPtin-metformin (JANUMET) 50-1000 MG per tablet Take 1 tablet by mouth daily.    Marland Kitchen UNABLE TO FIND 1,000 mg daily. Magnesium    . venlafaxine XR (EFFEXOR-XR) 150 MG 24 hr capsule Take 150 mg by mouth daily  with breakfast.     No current facility-administered medications for this visit.     PHYSICAL EXAMINATION: ECOG PERFORMANCE STATUS: 1  There were no vitals filed for this visit. There were no vitals filed for this visit.  GENERAL:alert, no distress and comfortable SKIN: skin color, texture, turgor are normal, no rashes or significant lesions EYES: normal, Conjunctiva are pink and non-injected, sclera clear OROPHARYNX:no exudate, no erythema and lips,  buccal mucosa, and tongue normal  NECK: supple, thyroid normal size, non-tender, without nodularity LYMPH:  no palpable lymphadenopathy in the cervical, axillary or inguinal LUNGS: clear to auscultation and percussion with normal breathing effort HEART: regular rate & rhythm and no murmurs and no lower extremity edema ABDOMEN:abdomen soft, non-tender and normal bowel sounds MUSCULOSKELETAL:no cyanosis of digits and no clubbing  NEURO: alert & oriented x 3 with fluent speech, no focal motor/sensory deficits EXTREMITIES: No lower extremity edema BREAST: No palpable masses or nodules in either right or left breasts. No palpable axillary supraclavicular or infraclavicular adenopathy no breast tenderness or nipple discharge. (exam performed in the presence of a chaperone)  LABORATORY DATA:  I have reviewed the data as listed CMP Latest Ref Rng & Units 06/14/2017 12/14/2016 12/14/2016  Glucose 70 - 140 mg/dL 153(H) 95 165(H)  BUN 7 - 26 mg/dL 10 4(L) 6.4(L)  Creatinine 0.60 - 1.10 mg/dL 0.75 0.50 0.7  Sodium 136 - 145 mmol/L 140 142 142  Potassium 3.5 - 5.1 mmol/L 3.7 3.6 3.7  Chloride 98 - 109 mmol/L 105 105 -  CO2 22 - 29 mmol/L 24 25 -  Calcium 8.4 - 10.4 mg/dL 9.1 9.5 -  Total Protein 6.4 - 8.3 g/dL 7.1 6.8 -  Total Bilirubin 0.2 - 1.2 mg/dL 0.3 0.53 -  Alkaline Phos 40 - 150 U/L 88 82 -  AST 5 - 34 U/L 52(H) 69(H) -  ALT 0 - 55 U/L 30 41 -    Lab Results  Component Value Date   WBC 6.4 06/14/2017   HGB 11.9 (L) 12/14/2016   HCT 34.9 06/14/2017   MCV 86.9 06/14/2017   PLT 195 06/14/2017   NEUTROABS 3.6 06/14/2017    ASSESSMENT & PLAN:  Breast cancer of lower-outer quadrant of left female breast Left breast invasive ductal carcinoma multifocal disease, 2 cm, 2 cm, 4 mm, T1 cN0 M0 stage IA 3 SLN negative but it showed IHC positive for micrometastatic disease less than 200 cells accompanied by extensive DCIS grade 2 margins negative, no lymphovascular invasion or dermal invasion,  Oncotype DX recurrence score 15 (9% ROR) and 6 (5% ROR) on each of the tumors with third tumor could not be done.  Current treatment: Letrozole started May 2015 Letrozole toxicities: 1. Hot flashes 2. Difficulty falling asleep at bedtime 3. Fatigue  4.  Memory problems  Right breast atypical lobular hyperplasiadetected during reduction mammoplasty surgery Rt Mammogram 11/20/2016: Negative MRI 11/13/2014: No MRI evidence of malignancy CT abdomen pelvis 12/14/2016: Benign  Breast Cancer Surveillance: 1. Breast exam 06/14/2017: Scar tissue left axilla, Left mastectomy with implant 2. Mammogram 11/17/2016: No mammographic evidence of malignancy 3.  Bone density: T score -1.8: Osteopenia  Return to clinic in 1 year for follow-up      No orders of the defined types were placed in this encounter.  The patient has a good understanding of the overall plan. she agrees with it. she will call with any problems that may develop before the next visit here.   Harriette Ohara, MD 08/14/17

## 2017-08-31 ENCOUNTER — Inpatient Hospital Stay: Payer: BLUE CROSS/BLUE SHIELD | Attending: Hematology and Oncology | Admitting: Hematology and Oncology

## 2017-08-31 DIAGNOSIS — Z79899 Other long term (current) drug therapy: Secondary | ICD-10-CM | POA: Insufficient documentation

## 2017-08-31 DIAGNOSIS — Z9012 Acquired absence of left breast and nipple: Secondary | ICD-10-CM | POA: Diagnosis not present

## 2017-08-31 DIAGNOSIS — C50512 Malignant neoplasm of lower-outer quadrant of left female breast: Secondary | ICD-10-CM | POA: Diagnosis present

## 2017-08-31 DIAGNOSIS — Z170 Estrogen receptor positive status [ER+]: Secondary | ICD-10-CM

## 2017-08-31 DIAGNOSIS — Z7984 Long term (current) use of oral hypoglycemic drugs: Secondary | ICD-10-CM | POA: Insufficient documentation

## 2017-08-31 DIAGNOSIS — Z888 Allergy status to other drugs, medicaments and biological substances status: Secondary | ICD-10-CM | POA: Insufficient documentation

## 2017-08-31 NOTE — Progress Notes (Signed)
Patient Care Team: Patric Dykes, MD as PCP - General (Family Medicine)  DIAGNOSIS:  Encounter Diagnosis  Name Primary?  . Malignant neoplasm of lower-outer quadrant of left breast of female, estrogen receptor positive ([place])     SUMMARY OF ONCOLOGIC HISTORY:   Breast cancer of lower-outer quadrant of left female breast ([place])   07/16/2013 Procedure    No BRCA1 or 2 PA LB2 mutation      08/14/2013 Surgery    Left breast mastectomy: Multifocal invasive ductal carcinoma 2 cm, 2 cm, 4 mm, focal DCIS, focal LCIS, margins negative, 1 node had micrometastatic disease less than 200 cells, Oncotype DX score is 15 and 6, well-differentiated, followed by reconstruction      09/15/2013 -  Anti-estrogen oral therapy    Letrozole 2.5 mg by mouth daily      03/31/2014 Surgery    Reduction mammoplasty on the right breast revealed atypical lobular hyperplasia       CHIEF COMPLIANT: Follow-up regarding adverse effects related to letrozole  INTERVAL HISTORY: Katherine Acosta is a 57 year old with above-mentioned history of left breast cancer treated with mastectomy and was on antiestrogen therapy since May 2015.  She had taken it for the past 4 years and recently she stopped taking it because of adverse effects from letrozole which included arthralgias myalgias and hot flashes.  All of the symptoms improved after stopping therapy.  She is here today to discuss whether she needs to continue further antiestrogen therapy or could she stop it. Since she stopped it she felt remarkably well.  She has great energy, improved appetite, improved affect, she wants to play with her grandchildren and enjoy her life.  REVIEW OF SYSTEMS:   Constitutional: Denies fevers, chills or abnormal weight loss Eyes: Denies blurriness of vision Ears, nose, mouth, throat, and face: Denies mucositis or sore throat Respiratory: Denies cough, dyspnea or wheezes Cardiovascular: Denies palpitation, chest  discomfort Gastrointestinal:  Denies nausea, heartburn or change in bowel habits Skin: Denies abnormal skin rashes Lymphatics: Denies new lymphadenopathy or easy bruising Neurological:Denies numbness, tingling or new weaknesses Behavioral/Psych: Mood is stable, no new changes  Extremities: No lower extremity edema Breast: Discomfort related to implants All other systems were reviewed with the patient and are negative.  I have reviewed the past medical history, past surgical history, social history and family history with the patient and they are unchanged from previous note.  ALLERGIES:  is allergic to tape.  MEDICATIONS:  Current Outpatient Medications  Medication Sig Dispense Refill  . benazepril (LOTENSIN) 20 MG tablet Take 20 mg by mouth daily.     . Calcium Carbonate-Vit D-Min (CALCIUM 1200 PO) Take by mouth daily.    . Cyanocobalamin (B-12) 5000 MCG CAPS Take by mouth daily.    . cyclobenzaprine (FLEXERIL) 10 MG tablet Take 1 tablet (10 mg total) by mouth 2 (two) times daily as needed for muscle spasms. 20 tablet 0  . ibuprofen (ADVIL,MOTRIN) 600 MG tablet Take 1 tablet (600 mg total) by mouth every 6 (six) hours as needed. 30 tablet 0  . letrozole (FEMARA) 2.5 MG tablet Take 1 tablet (2.5 mg total) by mouth daily. 90 tablet 3  . ondansetron (ZOFRAN ODT) 8 MG disintegrating tablet Take 1 tablet (8 mg total) by mouth every 8 (eight) hours as needed for nausea or vomiting. 42 tablet 0  . pantoprazole (PROTONIX) 40 MG tablet Take 40 mg by mouth daily.     . sitaGLIPtin-metformin (JANUMET) 50-1000 MG per tablet Take 1 tablet  by mouth daily.    Marland Kitchen UNABLE TO FIND 1,000 mg daily. Magnesium    . venlafaxine XR (EFFEXOR-XR) 150 MG 24 hr capsule Take 150 mg by mouth daily with breakfast.     No current facility-administered medications for this visit.     PHYSICAL EXAMINATION: ECOG PERFORMANCE STATUS: 1 - Symptomatic but completely ambulatory  Vitals:   08/31/17 0800  BP: 120/74   Pulse: 83  Resp: 18  Temp: 98.4 F (36.9 C)  SpO2: 97%   Filed Weights   08/31/17 0800  Weight: 175 lb 1.6 oz (79.4 kg)    GENERAL:alert, no distress and comfortable SKIN: skin color, texture, turgor are normal, no rashes or significant lesions EYES: normal, Conjunctiva are pink and non-injected, sclera clear OROPHARYNX:no exudate, no erythema and lips, buccal mucosa, and tongue normal  NECK: supple, thyroid normal size, non-tender, without nodularity LYMPH:  no palpable lymphadenopathy in the cervical, axillary or inguinal LUNGS: clear to auscultation and percussion with normal breathing effort HEART: regular rate & rhythm and no murmurs and no lower extremity edema ABDOMEN:abdomen soft, non-tender and normal bowel sounds MUSCULOSKELETAL:no cyanosis of digits and no clubbing  NEURO: alert & oriented x 3 with fluent speech, no focal motor/sensory deficits EXTREMITIES: No lower extremity edema  LABORATORY DATA:  I have reviewed the data as listed CMP Latest Ref Rng & Units 06/14/2017 12/14/2016 12/14/2016  Glucose 70 - 140 mg/dL 153(H) 95 165(H)  BUN 7 - 26 mg/dL 10 4(L) 6.4(L)  Creatinine 0.60 - 1.10 mg/dL 0.75 0.50 0.7  Sodium 136 - 145 mmol/L 140 142 142  Potassium 3.5 - 5.1 mmol/L 3.7 3.6 3.7  Chloride 98 - 109 mmol/L 105 105 -  CO2 22 - 29 mmol/L 24 25 -  Calcium 8.4 - 10.4 mg/dL 9.1 9.5 -  Total Protein 6.4 - 8.3 g/dL 7.1 6.8 -  Total Bilirubin 0.2 - 1.2 mg/dL 0.3 0.53 -  Alkaline Phos 40 - 150 U/L 88 82 -  AST 5 - 34 U/L 52(H) 69(H) -  ALT 0 - 55 U/L 30 41 -    Lab Results  Component Value Date   WBC 6.4 06/14/2017   HGB 11.8 06/14/2017   HCT 34.9 06/14/2017   MCV 86.9 06/14/2017   PLT 195 06/14/2017   NEUTROABS 3.6 06/14/2017    ASSESSMENT & PLAN:  Breast cancer of lower-outer quadrant of left female breast Left breast invasive ductal carcinoma multifocal disease, 2 cm, 2 cm, 4 mm, T1 cN0 M0 stage IA 3 SLN negative but it showed IHC positive for  micrometastatic disease less than 200 cells accompanied by extensive DCIS grade 2 margins negative, no lymphovascular invasion or dermal invasion, Oncotype DX recurrence score 15 (9% ROR) and 6 (5% ROR) on each of the tumors with third tumor could not be done.  Current treatment: Letrozole started May 2015 Letrozole toxicities: Patient was unable to tolerate letrozole and she stopped it.  Her symptoms went away. We discussed the pros and cons of further antiestrogen therapy.  We decided to give her a break from antiestrogen therapy for 3 to 4 months. After that she will resume at half a tablet daily. If she cannot tolerate that then she will discontinue her antiestrogen therapy.   Right breast atypical lobular hyperplasiadetected during reduction mammoplasty surgery Rt Mammogram 11/20/2016: Negative MRI 11/13/2014: No MRI evidence of malignancy CT abdomen pelvis 12/14/2016: Benign  She is hoping to go to [place] to see her other grandchild. Patient has an appointment February  2020 for follow-up.    No orders of the defined types were placed in this encounter.  The patient has a good understanding of the overall plan. she agrees with it. she will call with any problems that may develop before the next visit here.   Harriette Ohara, MD 08/31/17

## 2017-08-31 NOTE — Assessment & Plan Note (Signed)
Left breast invasive ductal carcinoma multifocal disease, 2 cm, 2 cm, 4 mm, T1 cN0 M0 stage IA 3 SLN negative but it showed IHC positive for micrometastatic disease less than 200 cells accompanied by extensive DCIS grade 2 margins negative, no lymphovascular invasion or dermal invasion, Oncotype DX recurrence score 15 (9% ROR) and 6 (5% ROR) on each of the tumors with third tumor could not be done.  Current treatment: Letrozole started May 2015 Letrozole toxicities: Patient was unable to tolerate letrozole and she stopped it.  Her symptoms went away. We discussed the pros and cons of further antiestrogen therapy.  Based on the extensive nature of her previous symptoms, we decided to try tamoxifen. She will start taking tamoxifen 10 mg daily and if she tolerates it well she will go up to 20 mg.  Return to clinic in 3 months for toxicity evaluation  Right breast atypical lobular hyperplasiadetected during reduction mammoplasty surgery Rt Mammogram 11/20/2016: Negative MRI 11/13/2014: No MRI evidence of malignancy CT abdomen pelvis 12/14/2016: Benign

## 2018-06-06 ENCOUNTER — Inpatient Hospital Stay: Payer: BLUE CROSS/BLUE SHIELD | Attending: Hematology and Oncology | Admitting: Hematology and Oncology

## 2018-06-06 NOTE — Assessment & Plan Note (Addendum)
Left breast invasive ductal carcinoma multifocal disease, 2 cm, 2 cm, 4 mm, T1 cN0 M0 stage IA 3 SLN negative but it showed IHC positive for micrometastatic disease less than 200 cells accompanied by extensive DCIS grade 2 margins negative, no lymphovascular invasion or dermal invasion, Oncotype DX recurrence score 15 (9% ROR) and 6 (5% ROR) on each of the tumors with third tumor could not be done.  Could not tolerate letrozole, treatment break for 3 to 4 months, resumed letrozole January 2020: Current treatment: None  Right breast atypical lobular hyperplasiadetected during reduction mammoplasty surgery RtMammogram7/23/2018: Negative MRI 11/13/2014: No MRI evidence of malignancy CT abdomen pelvis 12/14/2016: Benign  Return to clinic in 1 year for follow-up

## 2018-12-02 DIAGNOSIS — M8588 Other specified disorders of bone density and structure, other site: Secondary | ICD-10-CM | POA: Diagnosis not present

## 2018-12-02 DIAGNOSIS — M8589 Other specified disorders of bone density and structure, multiple sites: Secondary | ICD-10-CM | POA: Diagnosis not present

## 2018-12-02 DIAGNOSIS — C50912 Malignant neoplasm of unspecified site of left female breast: Secondary | ICD-10-CM | POA: Diagnosis not present

## 2018-12-02 DIAGNOSIS — Z170 Estrogen receptor positive status [ER+]: Secondary | ICD-10-CM | POA: Diagnosis not present

## 2018-12-02 DIAGNOSIS — M85852 Other specified disorders of bone density and structure, left thigh: Secondary | ICD-10-CM | POA: Diagnosis not present

## 2019-04-02 ENCOUNTER — Other Ambulatory Visit: Payer: Self-pay | Admitting: Obstetrics and Gynecology

## 2019-04-02 DIAGNOSIS — Z1231 Encounter for screening mammogram for malignant neoplasm of breast: Secondary | ICD-10-CM

## 2019-04-23 DIAGNOSIS — R202 Paresthesia of skin: Secondary | ICD-10-CM | POA: Diagnosis not present

## 2019-04-23 DIAGNOSIS — Z888 Allergy status to other drugs, medicaments and biological substances status: Secondary | ICD-10-CM | POA: Diagnosis not present

## 2019-04-23 DIAGNOSIS — Z853 Personal history of malignant neoplasm of breast: Secondary | ICD-10-CM | POA: Diagnosis not present

## 2019-04-23 DIAGNOSIS — I10 Essential (primary) hypertension: Secondary | ICD-10-CM | POA: Diagnosis not present

## 2019-04-23 DIAGNOSIS — Z91048 Other nonmedicinal substance allergy status: Secondary | ICD-10-CM | POA: Diagnosis not present

## 2019-04-23 DIAGNOSIS — E860 Dehydration: Secondary | ICD-10-CM | POA: Diagnosis not present

## 2019-04-23 DIAGNOSIS — R110 Nausea: Secondary | ICD-10-CM | POA: Diagnosis not present

## 2019-04-23 DIAGNOSIS — Z7984 Long term (current) use of oral hypoglycemic drugs: Secondary | ICD-10-CM | POA: Diagnosis not present

## 2019-04-23 DIAGNOSIS — Z7982 Long term (current) use of aspirin: Secondary | ICD-10-CM | POA: Diagnosis not present

## 2019-04-23 DIAGNOSIS — E785 Hyperlipidemia, unspecified: Secondary | ICD-10-CM | POA: Diagnosis not present

## 2019-04-23 DIAGNOSIS — Z79899 Other long term (current) drug therapy: Secondary | ICD-10-CM | POA: Diagnosis not present

## 2019-04-23 DIAGNOSIS — E119 Type 2 diabetes mellitus without complications: Secondary | ICD-10-CM | POA: Diagnosis not present

## 2019-05-20 DIAGNOSIS — M8589 Other specified disorders of bone density and structure, multiple sites: Secondary | ICD-10-CM | POA: Diagnosis not present

## 2019-05-20 DIAGNOSIS — C50912 Malignant neoplasm of unspecified site of left female breast: Secondary | ICD-10-CM | POA: Diagnosis not present

## 2019-05-20 DIAGNOSIS — Z853 Personal history of malignant neoplasm of breast: Secondary | ICD-10-CM | POA: Diagnosis not present

## 2019-05-20 DIAGNOSIS — E876 Hypokalemia: Secondary | ICD-10-CM | POA: Diagnosis not present

## 2019-05-20 DIAGNOSIS — D696 Thrombocytopenia, unspecified: Secondary | ICD-10-CM | POA: Diagnosis not present

## 2019-05-20 DIAGNOSIS — M8580 Other specified disorders of bone density and structure, unspecified site: Secondary | ICD-10-CM | POA: Diagnosis not present

## 2019-05-26 ENCOUNTER — Ambulatory Visit
Admission: RE | Admit: 2019-05-26 | Discharge: 2019-05-26 | Disposition: A | Discharge status: Home / Self Care | Payer: BC Managed Care – PPO | Source: Ambulatory Visit | Attending: Obstetrics and Gynecology | Admitting: Obstetrics and Gynecology

## 2019-05-26 ENCOUNTER — Other Ambulatory Visit: Payer: Self-pay

## 2019-05-26 DIAGNOSIS — Z1231 Encounter for screening mammogram for malignant neoplasm of breast: Secondary | ICD-10-CM

## 2019-06-20 ENCOUNTER — Encounter: Payer: Self-pay | Admitting: Obstetrics and Gynecology

## 2019-07-17 DIAGNOSIS — R1013 Epigastric pain: Secondary | ICD-10-CM | POA: Diagnosis not present

## 2019-07-17 DIAGNOSIS — K219 Gastro-esophageal reflux disease without esophagitis: Secondary | ICD-10-CM | POA: Diagnosis not present

## 2019-07-29 DIAGNOSIS — K3189 Other diseases of stomach and duodenum: Secondary | ICD-10-CM | POA: Diagnosis not present

## 2019-07-29 DIAGNOSIS — K2950 Unspecified chronic gastritis without bleeding: Secondary | ICD-10-CM | POA: Diagnosis not present

## 2019-07-29 DIAGNOSIS — R1013 Epigastric pain: Secondary | ICD-10-CM | POA: Diagnosis not present

## 2019-09-11 DIAGNOSIS — Z20828 Contact with and (suspected) exposure to other viral communicable diseases: Secondary | ICD-10-CM | POA: Diagnosis not present

## 2019-12-22 DIAGNOSIS — E876 Hypokalemia: Secondary | ICD-10-CM | POA: Diagnosis not present

## 2019-12-22 DIAGNOSIS — L98499 Non-pressure chronic ulcer of skin of other sites with unspecified severity: Secondary | ICD-10-CM | POA: Diagnosis not present

## 2019-12-22 DIAGNOSIS — Z79899 Other long term (current) drug therapy: Secondary | ICD-10-CM | POA: Diagnosis not present

## 2019-12-22 DIAGNOSIS — E1165 Type 2 diabetes mellitus with hyperglycemia: Secondary | ICD-10-CM | POA: Diagnosis not present

## 2019-12-22 DIAGNOSIS — Z170 Estrogen receptor positive status [ER+]: Secondary | ICD-10-CM | POA: Diagnosis not present

## 2019-12-22 DIAGNOSIS — C50912 Malignant neoplasm of unspecified site of left female breast: Secondary | ICD-10-CM | POA: Diagnosis not present

## 2019-12-22 DIAGNOSIS — M8589 Other specified disorders of bone density and structure, multiple sites: Secondary | ICD-10-CM | POA: Diagnosis not present

## 2019-12-22 DIAGNOSIS — D696 Thrombocytopenia, unspecified: Secondary | ICD-10-CM | POA: Diagnosis not present

## 2020-01-26 DIAGNOSIS — C50912 Malignant neoplasm of unspecified site of left female breast: Secondary | ICD-10-CM | POA: Diagnosis not present

## 2020-01-26 DIAGNOSIS — Z1231 Encounter for screening mammogram for malignant neoplasm of breast: Secondary | ICD-10-CM | POA: Diagnosis not present

## 2020-01-26 DIAGNOSIS — Z170 Estrogen receptor positive status [ER+]: Secondary | ICD-10-CM | POA: Diagnosis not present

## 2020-02-20 DIAGNOSIS — R112 Nausea with vomiting, unspecified: Secondary | ICD-10-CM | POA: Diagnosis not present

## 2020-02-20 DIAGNOSIS — G8929 Other chronic pain: Secondary | ICD-10-CM | POA: Diagnosis not present

## 2020-02-20 DIAGNOSIS — R1013 Epigastric pain: Secondary | ICD-10-CM | POA: Diagnosis not present

## 2020-02-20 DIAGNOSIS — R1011 Right upper quadrant pain: Secondary | ICD-10-CM | POA: Diagnosis not present

## 2020-03-02 DIAGNOSIS — K449 Diaphragmatic hernia without obstruction or gangrene: Secondary | ICD-10-CM | POA: Diagnosis not present

## 2020-03-02 DIAGNOSIS — K5900 Constipation, unspecified: Secondary | ICD-10-CM | POA: Diagnosis not present

## 2020-03-02 DIAGNOSIS — K760 Fatty (change of) liver, not elsewhere classified: Secondary | ICD-10-CM | POA: Diagnosis not present

## 2020-03-02 DIAGNOSIS — R590 Localized enlarged lymph nodes: Secondary | ICD-10-CM | POA: Diagnosis not present

## 2020-05-26 DIAGNOSIS — Z20822 Contact with and (suspected) exposure to covid-19: Secondary | ICD-10-CM | POA: Diagnosis not present

## 2020-05-28 DIAGNOSIS — R112 Nausea with vomiting, unspecified: Secondary | ICD-10-CM | POA: Diagnosis not present

## 2020-05-28 DIAGNOSIS — R1011 Right upper quadrant pain: Secondary | ICD-10-CM | POA: Diagnosis not present

## 2020-05-28 DIAGNOSIS — R1013 Epigastric pain: Secondary | ICD-10-CM | POA: Diagnosis not present

## 2020-05-28 DIAGNOSIS — K219 Gastro-esophageal reflux disease without esophagitis: Secondary | ICD-10-CM | POA: Diagnosis not present

## 2020-05-28 DIAGNOSIS — G8929 Other chronic pain: Secondary | ICD-10-CM | POA: Diagnosis not present

## 2020-05-31 DIAGNOSIS — Z9049 Acquired absence of other specified parts of digestive tract: Secondary | ICD-10-CM | POA: Diagnosis not present

## 2020-05-31 DIAGNOSIS — K219 Gastro-esophageal reflux disease without esophagitis: Secondary | ICD-10-CM | POA: Diagnosis not present

## 2020-05-31 DIAGNOSIS — K449 Diaphragmatic hernia without obstruction or gangrene: Secondary | ICD-10-CM | POA: Diagnosis not present

## 2020-06-08 DIAGNOSIS — Z20822 Contact with and (suspected) exposure to covid-19: Secondary | ICD-10-CM | POA: Diagnosis not present

## 2020-06-23 DIAGNOSIS — D696 Thrombocytopenia, unspecified: Secondary | ICD-10-CM | POA: Diagnosis not present

## 2020-06-23 DIAGNOSIS — Z6830 Body mass index (BMI) 30.0-30.9, adult: Secondary | ICD-10-CM | POA: Diagnosis not present

## 2020-06-23 DIAGNOSIS — R739 Hyperglycemia, unspecified: Secondary | ICD-10-CM | POA: Diagnosis not present

## 2020-06-23 DIAGNOSIS — Z170 Estrogen receptor positive status [ER+]: Secondary | ICD-10-CM | POA: Diagnosis not present

## 2020-06-23 DIAGNOSIS — Z5112 Encounter for antineoplastic immunotherapy: Secondary | ICD-10-CM | POA: Diagnosis not present

## 2020-06-23 DIAGNOSIS — R748 Abnormal levels of other serum enzymes: Secondary | ICD-10-CM | POA: Diagnosis not present

## 2020-06-23 DIAGNOSIS — E669 Obesity, unspecified: Secondary | ICD-10-CM | POA: Diagnosis not present

## 2020-06-23 DIAGNOSIS — D698 Other specified hemorrhagic conditions: Secondary | ICD-10-CM | POA: Diagnosis not present

## 2020-06-23 DIAGNOSIS — C50912 Malignant neoplasm of unspecified site of left female breast: Secondary | ICD-10-CM | POA: Diagnosis not present

## 2020-06-23 DIAGNOSIS — M8589 Other specified disorders of bone density and structure, multiple sites: Secondary | ICD-10-CM | POA: Diagnosis not present

## 2020-06-27 DIAGNOSIS — Z20822 Contact with and (suspected) exposure to covid-19: Secondary | ICD-10-CM | POA: Diagnosis not present

## 2020-06-27 DIAGNOSIS — Z01812 Encounter for preprocedural laboratory examination: Secondary | ICD-10-CM | POA: Diagnosis not present

## 2020-06-30 DIAGNOSIS — E785 Hyperlipidemia, unspecified: Secondary | ICD-10-CM | POA: Diagnosis not present

## 2020-06-30 DIAGNOSIS — Z853 Personal history of malignant neoplasm of breast: Secondary | ICD-10-CM | POA: Diagnosis not present

## 2020-06-30 DIAGNOSIS — R112 Nausea with vomiting, unspecified: Secondary | ICD-10-CM | POA: Diagnosis not present

## 2020-06-30 DIAGNOSIS — K2100 Gastro-esophageal reflux disease with esophagitis, without bleeding: Secondary | ICD-10-CM | POA: Diagnosis not present

## 2020-06-30 DIAGNOSIS — E119 Type 2 diabetes mellitus without complications: Secondary | ICD-10-CM | POA: Diagnosis not present

## 2020-06-30 DIAGNOSIS — F419 Anxiety disorder, unspecified: Secondary | ICD-10-CM | POA: Diagnosis not present

## 2020-06-30 DIAGNOSIS — G4730 Sleep apnea, unspecified: Secondary | ICD-10-CM | POA: Diagnosis not present

## 2020-06-30 DIAGNOSIS — F32A Depression, unspecified: Secondary | ICD-10-CM | POA: Diagnosis not present

## 2020-06-30 DIAGNOSIS — K219 Gastro-esophageal reflux disease without esophagitis: Secondary | ICD-10-CM | POA: Diagnosis not present

## 2020-06-30 DIAGNOSIS — N200 Calculus of kidney: Secondary | ICD-10-CM | POA: Diagnosis not present

## 2020-06-30 DIAGNOSIS — K209 Esophagitis, unspecified without bleeding: Secondary | ICD-10-CM | POA: Diagnosis not present

## 2020-06-30 DIAGNOSIS — Z6829 Body mass index (BMI) 29.0-29.9, adult: Secondary | ICD-10-CM | POA: Diagnosis not present

## 2020-06-30 DIAGNOSIS — R519 Headache, unspecified: Secondary | ICD-10-CM | POA: Diagnosis not present

## 2020-06-30 DIAGNOSIS — E669 Obesity, unspecified: Secondary | ICD-10-CM | POA: Diagnosis not present

## 2020-06-30 DIAGNOSIS — I10 Essential (primary) hypertension: Secondary | ICD-10-CM | POA: Diagnosis not present

## 2020-06-30 DIAGNOSIS — K2970 Gastritis, unspecified, without bleeding: Secondary | ICD-10-CM | POA: Diagnosis not present

## 2020-06-30 DIAGNOSIS — Z9049 Acquired absence of other specified parts of digestive tract: Secondary | ICD-10-CM | POA: Diagnosis not present

## 2020-07-27 DIAGNOSIS — K209 Esophagitis, unspecified without bleeding: Secondary | ICD-10-CM | POA: Diagnosis not present

## 2020-10-22 ENCOUNTER — Telehealth: Payer: Self-pay

## 2020-10-22 NOTE — Telephone Encounter (Signed)
Referral notes sent from [facility], Phone #: [phone], Fax #: [phone]   Notes sent to scheduling

## 2020-10-26 ENCOUNTER — Encounter: Payer: Self-pay | Admitting: Hematology and Oncology

## 2020-10-26 DIAGNOSIS — R9431 Abnormal electrocardiogram [ECG] [EKG]: Secondary | ICD-10-CM | POA: Insufficient documentation

## 2020-10-26 DIAGNOSIS — I10 Essential (primary) hypertension: Secondary | ICD-10-CM | POA: Insufficient documentation

## 2020-10-26 NOTE — Progress Notes (Signed)
Cardiology Office Note   Date:  10/28/2020   ID:  Katherine Acosta, DOB 12/13/1960, MRN [id number]  PCP:  Geannie Risen, MD  Cardiologist:   None Referring:  Geannie Risen, MD  Chief Complaint  Patient presents with   Abnormal ECG       History of Present Illness: Katherine Acosta is a 60 y.o. female who is referred by Geannie Risen, MD for evaluation of an abnormal EKG with difficult to control HTN: Unfortunately do not have a copy of that EKG.  I do have some EKGs from 2018 and 2016 and she had some lower poor anterior R wave progression.  She has had same finding on her EKG.  She does have some left shoulder and chest discomfort but this has been since reconstructive surgery for breast mastectomy.  This is a discomfort that hurts when she moves her arm a certain way.  She does not describe resting shortness of breath, PND or orthopnea.  She does not describe exertional substernal chest discomfort or neck discomfort.  She does some household chores but she has not been as active as I would like.  She did just retire.  She has never had any prior cardiac testing or work-up.   Past Medical History:  Diagnosis Date   Anemia    low iron   Anxiety    Cancer ([place])    left breast cancer - march, 2015   Depression    Diabetes mellitus without complication ([place])    type 2   Diarrhea    Headache    migraines   History of hiatal hernia    Hypertension    Personal history of chemotherapy    PONV (postoperative nausea and vomiting)    also has difficulty waking up    Past Surgical History:  Procedure Laterality Date   ABDOMINAL HYSTERECTOMY     BREAST RECONSTRUCTION Left 05/21/2014   DR BOWERS   BREAST SURGERY     breast reduction on right   CHOLECYSTECTOMY     COLONOSCOPY     ESOPHAGUS SURGERY     had stomach rewrapped around esophagus   KNEE ARTHROSCOPY Left    LATISSIMUS FLAP TO BREAST Left 05/21/2014   Procedure: LATISSIMUS FLAP TO LEFT BREAST WITH PLACEMENT OF  IMPLANT FOR RECONSTRUCTION;  Surgeon: Crissie Reese, MD;  Location: [facility];  Service: Plastics;  Laterality: Left;   MASTECTOMY     MASTECTOMY Left 07/2013   REDUCTION MAMMAPLASTY Right    TONSILLECTOMY       Current Outpatient Medications  Medication Sig Dispense Refill   benazepril (LOTENSIN) 20 MG tablet Take 1 tablet by mouth daily.     Calcium Carbonate-Vit D-Min (CALCIUM 1200 PO) Take by mouth daily.     Cholecalciferol (VITAMIN D3) 10 MCG (400 UNIT) tablet Take by mouth.     Coenzyme Q10 100 MG TABS Take by mouth.     Cyanocobalamin (B-12) 5000 MCG CAPS Take by mouth daily.     cyclobenzaprine (FLEXERIL) 10 MG tablet Take 1 tablet (10 mg total) by mouth 2 (two) times daily as needed for muscle spasms. 20 tablet 0   levocetirizine (XYZAL) 5 MG tablet Take by mouth.     Multiple Vitamin (MULTIVITAMIN) capsule Take 1 capsule by mouth daily.     omeprazole (PRILOSEC) 40 MG capsule Take 40 mg by mouth daily.     ondansetron (ZOFRAN) 4 MG tablet Take by mouth.     pravastatin (PRAVACHOL) 20 MG tablet Take  by mouth.     pregabalin (LYRICA) 75 MG capsule Take 75 mg by mouth 2 (two) times daily.     sitaGLIPtin-metformin (JANUMET) 50-1000 MG per tablet Take 1 tablet by mouth daily.     traZODone (DESYREL) 50 MG tablet Take by mouth.     venlafaxine XR (EFFEXOR-XR) 150 MG 24 hr capsule Take 150 mg by mouth daily with breakfast.     No current facility-administered medications for this visit.    Allergies:   Tape    Social History:  The patient  reports that she has never smoked. She has never used smokeless tobacco. She reports current alcohol use. She reports that she does not use drugs.   Family History:  The patient's family history includes Breast cancer in her paternal aunt, paternal aunt, paternal aunt, and paternal aunt; CAD in her mother; COPD in her mother; Diabetes in her mother; Heart attack (age of onset: 54) in her father.    ROS:  Please see the history of present  illness.   Otherwise, review of systems are positive for none.   All other systems are reviewed and negative.    PHYSICAL EXAM: VS:  BP 132/70   Pulse 87   Ht 5\' 2"  (1.575 m)   Wt 163 lb 3.2 oz (74 kg)   SpO2 97%   BMI 29.85 kg/m  , BMI Body mass index is 29.85 kg/m. GENERAL:  Well appearing HEENT:  Pupils equal round and reactive, fundi not visualized, oral mucosa unremarkable NECK:  No jugular venous distention, waveform within normal limits, carotid upstroke brisk and symmetric, no bruits, no thyromegaly LYMPHATICS:  No cervical, inguinal adenopathy LUNGS:  Clear to auscultation bilaterally BACK:  No CVA tenderness CHEST:  Unremarkable HEART:  PMI not displaced or sustained,S1 and S2 within normal limits, no S3, no S4, no clicks, no rubs, soft apical brief systolic murmur heard also at the right upper sternal border, early peaking, no diastolic murmurs ABD:  Flat, positive bowel sounds normal in frequency in pitch, no bruits, no rebound, no guarding, no midline pulsatile mass, no hepatomegaly, no splenomegaly EXT:  2 plus pulses throughout, no edema, no cyanosis no clubbing SKIN:  No rashes no nodules NEURO:  Cranial nerves II through XII grossly intact, motor grossly intact throughout PSYCH:  Cognitively intact, oriented to person place and time    EKG:  EKG is ordered today. The ekg ordered today demonstrates sinus rhythm, rate 87, axis within normal limits, intervals within normal limits, poor anterior R wave progression, no acute ST-T wave changes.   Recent Labs: No results found for requested labs within last 8760 hours.    Lipid Panel No results found for: CHOL, TRIG, HDL, CHOLHDL, VLDL, LDLCALC, LDLDIRECT    Wt Readings from Last 3 Encounters:  10/28/20 163 lb 3.2 oz (74 kg)  08/31/17 175 lb 1.6 oz (79.4 kg)  06/14/17 175 lb 8 oz (79.6 kg)      Other studies Reviewed: Additional studies/ records that were reviewed today include: None. Review of the above  records demonstrates:  NA   ASSESSMENT AND PLAN:  Abnormal EKG: EKG demonstrates poor anterior R wave progression but I do not think it represents previous myocardial infarction.  She has no symptoms consistent with this.  She does have some risk factors.  I am going to start with a coronary calcium score and have a low threshold for exercise testing based on the risk factors particularly her family history but I do not  think she has any unstable symptoms at this point.  HTN: The blood pressure is well controlled.  No change in therapy.  Murmur: Murmur I believe it is related to some aortic sclerosis or mild stenosis.  This can be followed clinically over the years.  No change in therapy.    Current medicines are reviewed at length with the patient today.  The patient does not have concerns regarding medicines.  The following changes have been made:  no change  Labs/ tests ordered today include:   Orders Placed This Encounter  Procedures   CT CARDIAC SCORING (SELF PAY ONLY)   EKG 12-Lead      Disposition:   FU with me as needed.      Signed, Minus Breeding, MD  10/28/2020 9:03 AM    [facility]

## 2020-10-28 ENCOUNTER — Ambulatory Visit (INDEPENDENT_AMBULATORY_CARE_PROVIDER_SITE_OTHER): Payer: BC Managed Care – PPO | Admitting: Cardiology

## 2020-10-28 ENCOUNTER — Encounter: Payer: Self-pay | Admitting: Cardiology

## 2020-10-28 ENCOUNTER — Other Ambulatory Visit: Payer: Self-pay

## 2020-10-28 ENCOUNTER — Encounter: Payer: Self-pay | Admitting: Hematology and Oncology

## 2020-10-28 VITALS — BP 132/70 | HR 87 | Ht 62.0 in | Wt 163.2 lb

## 2020-10-28 DIAGNOSIS — I10 Essential (primary) hypertension: Secondary | ICD-10-CM | POA: Diagnosis not present

## 2020-10-28 DIAGNOSIS — R9431 Abnormal electrocardiogram [ECG] [EKG]: Secondary | ICD-10-CM | POA: Diagnosis not present

## 2020-10-28 NOTE — Patient Instructions (Signed)
  Testing/Procedures:  CT CORONARY CALCIUM SCORE AT [place]   Follow-Up: At [facility], you and your health needs are our priority.  As part of our continuing mission to provide you with exceptional heart care, we have created designated Provider Care Teams.  These Care Teams include your primary Cardiologist (physician) and Advanced Practice Providers (APPs -  Physician Assistants and Nurse Practitioners) who all work together to provide you with the care you need, when you need it.  We recommend signing up for the patient portal called "MyChart".  Sign up information is provided on this After Visit Summary.  MyChart is used to connect with patients for Virtual Visits (Telemedicine).  Patients are able to view lab/test results, encounter notes, upcoming appointments, etc.  Non-urgent messages can be sent to your provider as well.   To learn more about what you can do with MyChart, go to [email or web address].    Your next appointment:    AS NEEDED

## 2020-11-09 ENCOUNTER — Other Ambulatory Visit: Payer: Self-pay

## 2020-11-09 ENCOUNTER — Ambulatory Visit
Admission: RE | Admit: 2020-11-09 | Discharge: 2020-11-09 | Disposition: A | Discharge status: Home / Self Care | Payer: BC Managed Care – PPO | Source: Ambulatory Visit | Attending: Obstetrics and Gynecology | Admitting: Obstetrics and Gynecology

## 2020-11-09 ENCOUNTER — Other Ambulatory Visit: Payer: Self-pay | Admitting: Obstetrics and Gynecology

## 2020-11-09 DIAGNOSIS — M542 Cervicalgia: Secondary | ICD-10-CM | POA: Diagnosis not present

## 2020-11-09 DIAGNOSIS — M4312 Spondylolisthesis, cervical region: Secondary | ICD-10-CM | POA: Diagnosis not present

## 2020-11-09 DIAGNOSIS — R531 Weakness: Secondary | ICD-10-CM | POA: Diagnosis not present

## 2020-11-09 DIAGNOSIS — M25612 Stiffness of left shoulder, not elsewhere classified: Secondary | ICD-10-CM

## 2020-11-09 DIAGNOSIS — M47812 Spondylosis without myelopathy or radiculopathy, cervical region: Secondary | ICD-10-CM | POA: Diagnosis not present

## 2020-11-09 DIAGNOSIS — M25512 Pain in left shoulder: Secondary | ICD-10-CM | POA: Diagnosis not present

## 2020-11-24 ENCOUNTER — Ambulatory Visit (INDEPENDENT_AMBULATORY_CARE_PROVIDER_SITE_OTHER)
Admission: RE | Admit: 2020-11-24 | Discharge: 2020-11-24 | Disposition: A | Discharge status: Home / Self Care | Payer: Self-pay | Source: Ambulatory Visit | Attending: Cardiology | Admitting: Cardiology

## 2020-11-24 ENCOUNTER — Other Ambulatory Visit: Payer: Self-pay

## 2020-11-24 DIAGNOSIS — M25512 Pain in left shoulder: Secondary | ICD-10-CM | POA: Diagnosis not present

## 2020-11-24 DIAGNOSIS — R9431 Abnormal electrocardiogram [ECG] [EKG]: Secondary | ICD-10-CM

## 2020-11-30 ENCOUNTER — Other Ambulatory Visit: Payer: Self-pay

## 2020-11-30 DIAGNOSIS — R931 Abnormal findings on diagnostic imaging of heart and coronary circulation: Secondary | ICD-10-CM

## 2021-01-04 ENCOUNTER — Telehealth (HOSPITAL_COMMUNITY): Payer: Self-pay | Admitting: *Deleted

## 2021-01-04 NOTE — Telephone Encounter (Signed)
Close encounter 

## 2021-01-05 ENCOUNTER — Other Ambulatory Visit: Payer: Self-pay

## 2021-01-05 ENCOUNTER — Ambulatory Visit (HOSPITAL_COMMUNITY)
Admission: RE | Admit: 2021-01-05 | Discharge: 2021-01-05 | Disposition: A | Discharge status: Home / Self Care | Payer: BC Managed Care – PPO | Source: Ambulatory Visit | Attending: Internal Medicine | Admitting: Internal Medicine

## 2021-01-05 DIAGNOSIS — R931 Abnormal findings on diagnostic imaging of heart and coronary circulation: Secondary | ICD-10-CM | POA: Insufficient documentation

## 2021-01-05 LAB — EXERCISE TOLERANCE TEST
Angina Index: 1
Base ST Depression (mm): 0 mm
Duke Treadmill Score: 2
Estimated workload: 7.3
Exercise duration (min): 6 min
Exercise duration (sec): 15 s
MPHR: 158 {beats}/min
Peak HR: 164 {beats}/min
Percent HR: 103 %
Rest HR: 86 {beats}/min
ST Depression (mm): 0 mm

## 2021-01-11 ENCOUNTER — Telehealth: Payer: Self-pay | Admitting: Cardiology

## 2021-01-11 NOTE — Telephone Encounter (Signed)
Follow Up:      Pt is returning a call, concerning her Stress Test results.

## 2021-01-11 NOTE — Telephone Encounter (Signed)
Called patient, aware of stress results. Patient verbalized understanding.

## 2021-02-01 ENCOUNTER — Encounter (HOSPITAL_COMMUNITY): Payer: BC Managed Care – PPO

## 2021-12-18 IMAGING — CR DG CERVICAL SPINE 2 OR 3 VIEWS
3 series · 3 of 3 positions shown · non-contrast
Comparison: December 14, 2016.

CLINICAL DATA: Weakness reduced range of motion and shoulder pain
in LEFT side of the neck.

EXAM:
CERVICAL SPINE - 2-3 VIEW

[w cervical spine lat]
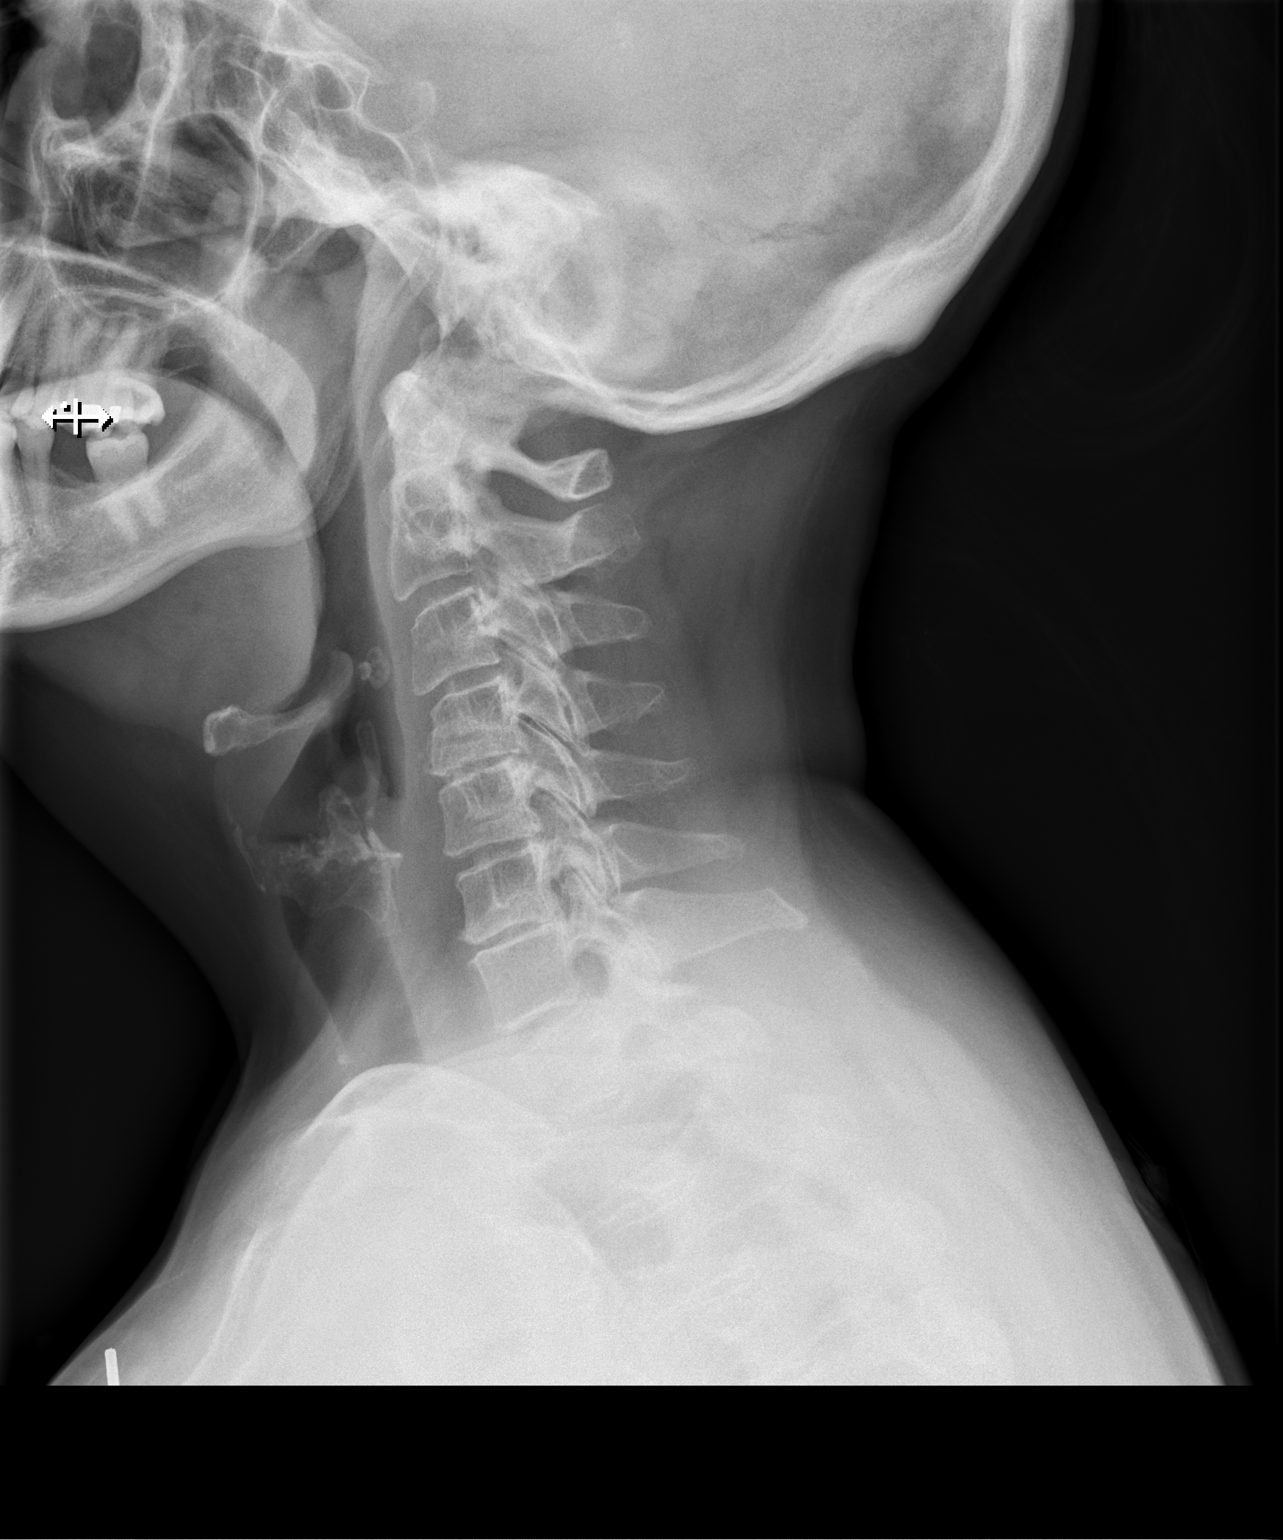

[w cervical spine ap]
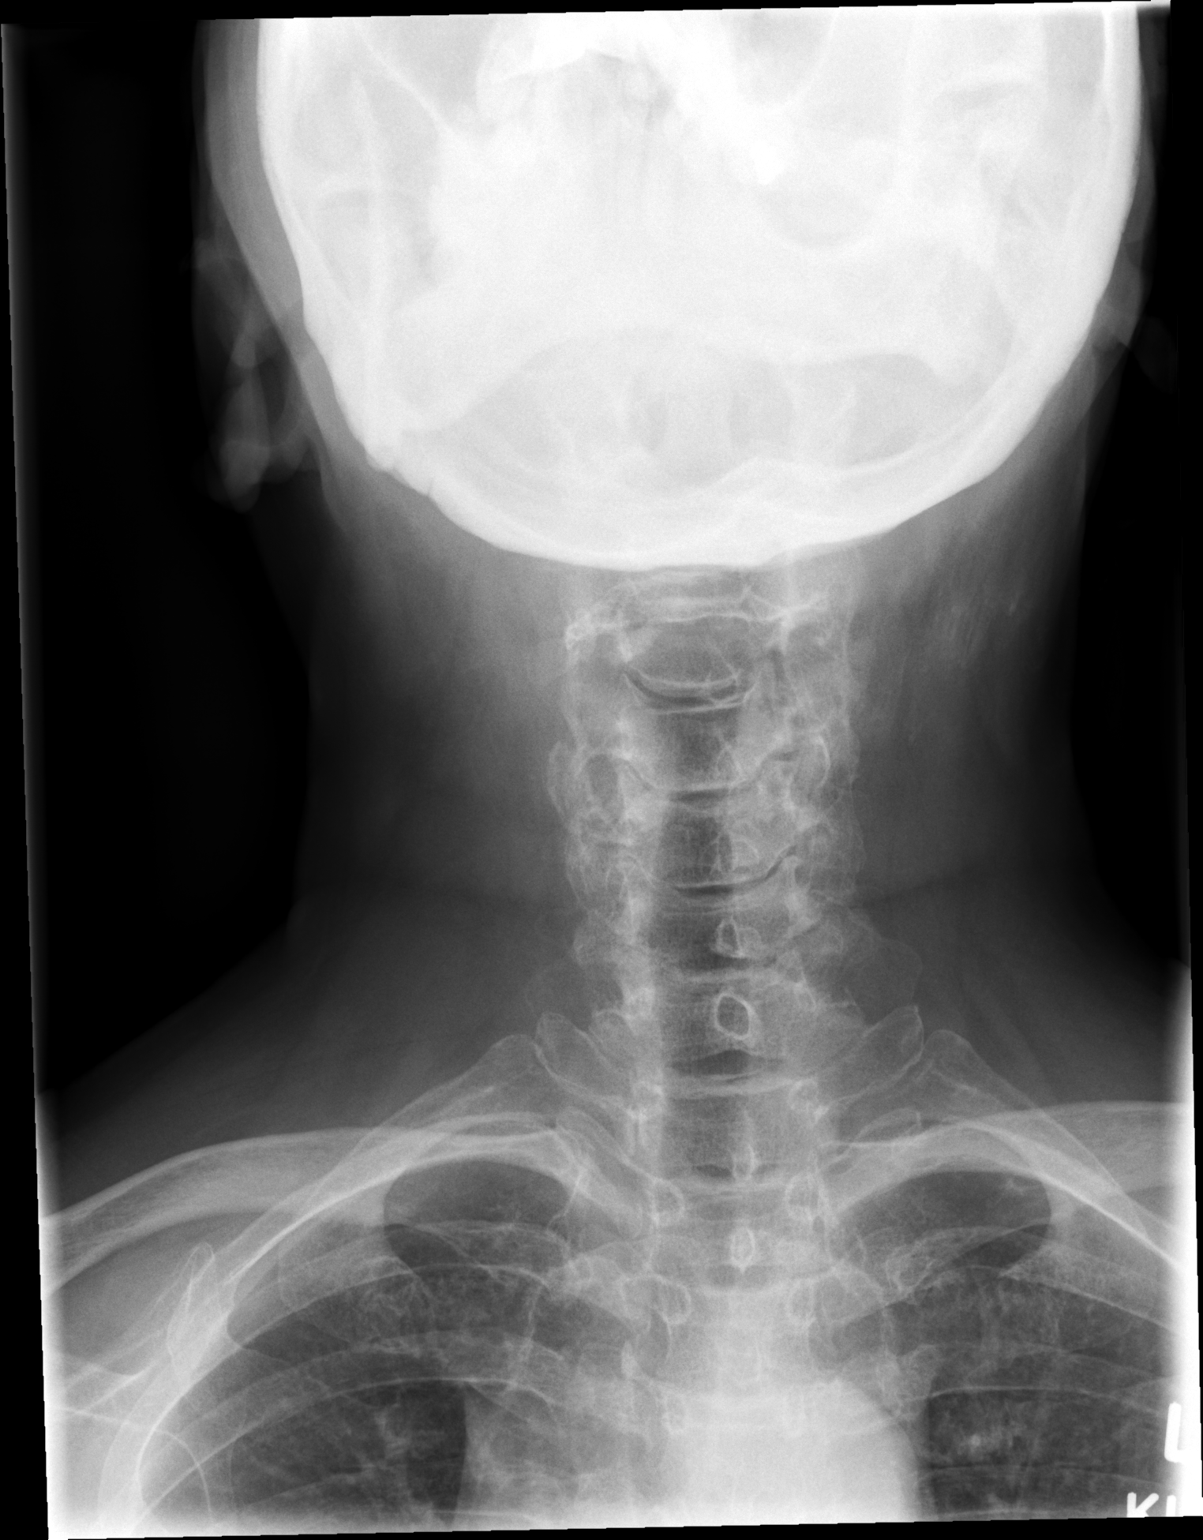

[w cervical spine odontoid]
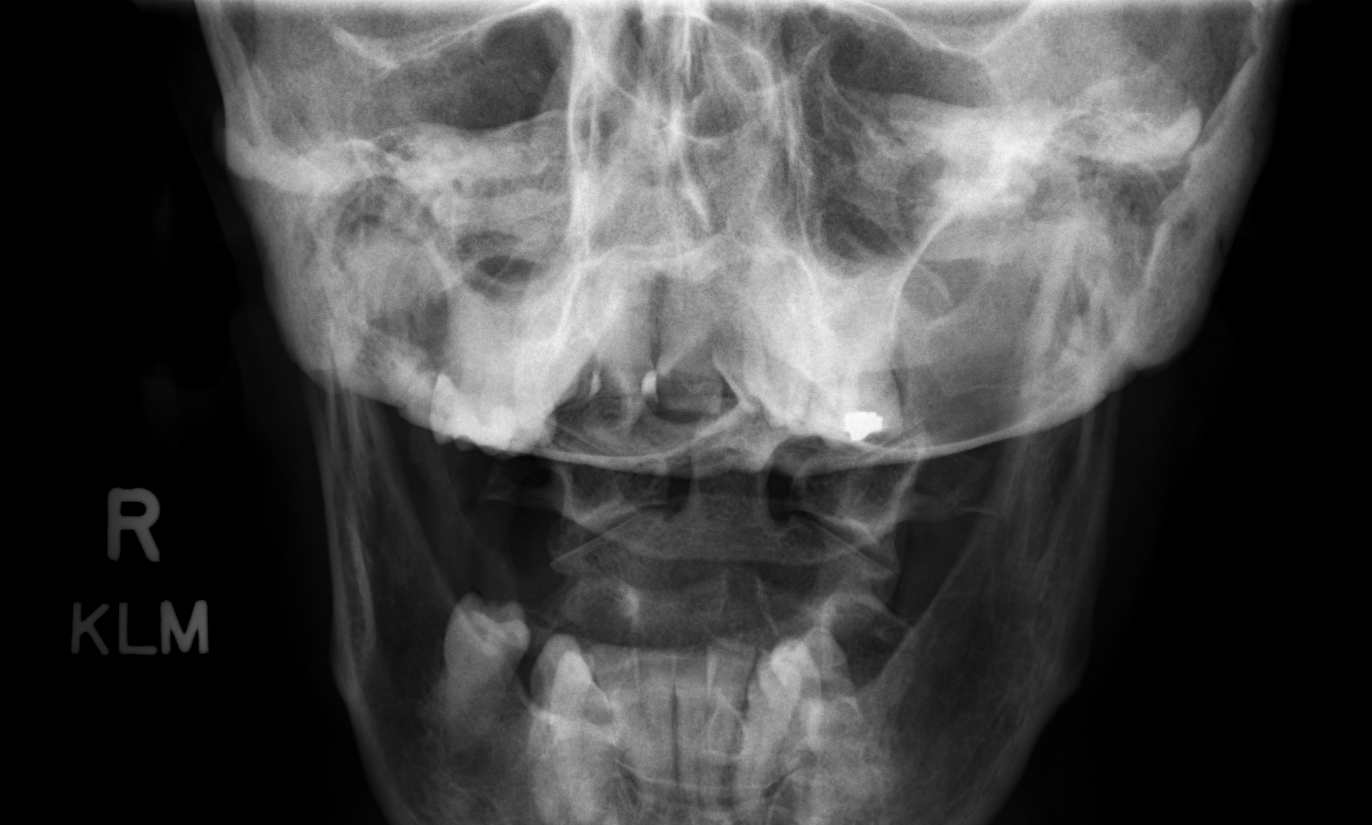

[3 of 3 positions shown; findings below may reference images not displayed]

FINDINGS: Prevertebral soft tissues are normal.

Straightening of normal cervical lordotic curvature is similar to
the prior study with increased degenerative change throughout the
cervical spine compared to the previous exam. Cervical spine is
visualized through T1. There is minimal anterolisthesis of C3 on C4
which is similar to the prior study. Disc space narrowing at C4-5
and C5-6 and C6-7 has progressed greatest at C4-5.

Facet arthropathy in general is likely worse on the LEFT as compared
to the RIGHT. This is greatest about the mid and lower cervical
spine.

No acute findings.
IMPRESSION: Increased degenerative changes in the cervical spine compared to the
previous study. No acute findings.

## 2022-05-01 DIAGNOSIS — E1110 Type 2 diabetes mellitus with ketoacidosis without coma: Secondary | ICD-10-CM

## 2022-05-01 HISTORY — DX: Type 2 diabetes mellitus with ketoacidosis without coma: E11.10

## 2024-03-25 ENCOUNTER — Ambulatory Visit: Payer: Self-pay | Admitting: Emergency Medicine

## 2024-03-25 DIAGNOSIS — G8929 Other chronic pain: Secondary | ICD-10-CM

## 2024-03-25 NOTE — H&P (View-Only) (Signed)
 TOTAL KNEE ADMISSION H&P  Patient is being admitted for right total knee arthroplasty.  Subjective:  Chief Complaint:right knee pain.  HPI: 26 Banales, 63 y.o. female, has a history of pain and functional disability in the right knee due to arthritis and has failed non-surgical conservative treatments for greater than 12 weeks to includeNSAID's and/or analgesics, corticosteriod injections, flexibility and strengthening excercises, and activity modification.  Onset of symptoms was gradual, starting 2 years ago with gradually worsening course since that time. The patient noted no past surgery on the right knee(s).  Patient currently rates pain in the right knee(s) at 9 out of 10 with activity. Patient has night pain, worsening of pain with activity and weight bearing, pain that interferes with activities of daily living, and pain with passive range of motion.  Patient has evidence of periarticular osteophytes and joint space narrowing by imaging studies.  There is no active infection.  Patient Active Problem List   Diagnosis Date Noted   Nonspecific abnormal electrocardiogram (ECG) (EKG) 10/26/2020   Essential hypertension 10/26/2020   Breast cancer, left breast (HCC) 05/21/2014   Breast cancer of lower-outer quadrant of left female breast (HCC) 05/18/2014   STRICTURE AND STENOSIS OF ESOPHAGUS 06/28/2009   BARRETTS ESOPHAGUS 06/28/2009   ABDOMINAL PAIN, EPIGASTRIC 06/28/2009   CHEST PAIN UNSPECIFIED 04/28/2009   GERD 04/21/2009   Past Medical History:  Diagnosis Date   Anemia    low iron   Anxiety    Cancer (HCC)    left breast cancer - march, 2015   Depression    Diabetes mellitus without complication (HCC)    type 2   Diarrhea    Headache    migraines   History of hiatal hernia    Hypertension    Personal history of chemotherapy    PONV (postoperative nausea and vomiting)    also has difficulty waking up    Past Surgical History:  Procedure Laterality Date   ABDOMINAL  HYSTERECTOMY     BREAST RECONSTRUCTION Left 05/21/2014   DR BOWERS   BREAST SURGERY     breast reduction on right   CHOLECYSTECTOMY     COLONOSCOPY     ESOPHAGUS SURGERY     had stomach rewrapped around esophagus   KNEE ARTHROSCOPY Left    LATISSIMUS FLAP TO BREAST Left 05/21/2014   Procedure: LATISSIMUS FLAP TO LEFT BREAST WITH PLACEMENT OF IMPLANT FOR RECONSTRUCTION;  Surgeon: Alm Sick, MD;  Location: [facility] OR;  Service: Plastics;  Laterality: Left;   MASTECTOMY     MASTECTOMY Left 07/2013   REDUCTION MAMMAPLASTY Right    TONSILLECTOMY      Current Outpatient Medications  Medication Sig Dispense Refill Last Dose/Taking   benazepril  (LOTENSIN ) 20 MG tablet Take 1 tablet by mouth daily.      Calcium Carbonate-Vit D-Min (CALCIUM 1200 PO) Take by mouth daily.      Cholecalciferol (VITAMIN D3) 10 MCG (400 UNIT) tablet Take by mouth.      Coenzyme Q10 100 MG TABS Take by mouth.      Cyanocobalamin (B-12) 5000 MCG CAPS Take by mouth daily.      cyclobenzaprine  (FLEXERIL ) 10 MG tablet Take 1 tablet (10 mg total) by mouth 2 (two) times daily as needed for muscle spasms. 20 tablet 0    levocetirizine (XYZAL) 5 MG tablet Take by mouth.      Multiple Vitamin (MULTIVITAMIN) capsule Take 1 capsule by mouth daily.      omeprazole (PRILOSEC) 40 MG capsule Take  40 mg by mouth daily.      ondansetron  (ZOFRAN ) 4 MG tablet Take by mouth.      pravastatin (PRAVACHOL) 20 MG tablet Take by mouth.      pregabalin (LYRICA) 75 MG capsule Take 75 mg by mouth 2 (two) times daily.      sitaGLIPtin-metformin  (JANUMET) 50-1000 MG per tablet Take 1 tablet by mouth daily.      traZODone (DESYREL) 50 MG tablet Take by mouth.      venlafaxine  XR (EFFEXOR -XR) 150 MG 24 hr capsule Take 150 mg by mouth daily with breakfast.      No current facility-administered medications for this visit.   Allergies  Allergen Reactions   Tape Itching and Other (See Comments)    Adhesive (plastic tape) causes itching and skin to  peel    Social History   Tobacco Use   Smoking status: Never   Smokeless tobacco: Never  Substance Use Topics   Alcohol use: Yes    Comment: occasional    Family History  Problem Relation Age of Onset   Diabetes Mother    COPD Mother    CAD Mother    Heart attack Father 2   Breast cancer Paternal Aunt    Breast cancer Paternal Aunt    Breast cancer Paternal Aunt    Breast cancer Paternal Aunt      Review of Systems  Musculoskeletal:  Positive for arthralgias.  All other systems reviewed and are negative.   Objective:  Physical Exam Constitutional:      General: She is not in acute distress.    Appearance: Normal appearance. She is not ill-appearing.  HENT:     Head: Normocephalic and atraumatic.     Right Ear: External ear normal.     Left Ear: External ear normal.     Nose: Nose normal.     Mouth/Throat:     Mouth: Mucous membranes are moist.     Pharynx: Oropharynx is clear.  Eyes:     Extraocular Movements: Extraocular movements intact.     Conjunctiva/sclera: Conjunctivae normal.  Cardiovascular:     Rate and Rhythm: Normal rate.     Pulses: Normal pulses.  Pulmonary:     Effort: Pulmonary effort is normal.  Abdominal:     General: Bowel sounds are normal.     Palpations: Abdomen is soft.  Musculoskeletal:        General: Tenderness present.     Cervical back: Normal range of motion and neck supple.     Comments: TTP over medial and lateral joint line, medial worse than lateral.  No calf tenderness, swelling, or erythema.  No overlying lesions of area of chief complaint.  Decreased strength and ROM due to elicited pain.  Dorsiflexion and plantarflexion intact.  Stable to varus and valgus stress.  BLE appear grossly neurovascularly intact.  Gait mildly antalgic.   Skin:    General: Skin is warm and dry.  Neurological:     Mental Status: She is alert and oriented to person, place, and time. Mental status is at baseline.  Psychiatric:        Mood and  Affect: Mood normal.        Behavior: Behavior normal.     Vital signs in last 24 hours: @VSRANGES @  Labs:   Estimated body mass index is 29.85 kg/m as calculated from the following:   Height as of 10/28/20: 5' 2 (1.575 m).   Weight as of 10/28/20: 74 kg.  Imaging Review Plain radiographs demonstrate severe degenerative joint disease of the right knee(s). The overall alignment issignificant varus. The bone quality appears to be fair for age and reported activity level.      Assessment/Plan:  End stage arthritis, right knee   The patient history, physical examination, clinical judgment of the provider and imaging studies are consistent with end stage degenerative joint disease of the right knee(s) and total knee arthroplasty is deemed medically necessary. The treatment options including medical management, injection therapy arthroscopy and arthroplasty were discussed at length. The risks and benefits of total knee arthroplasty were presented and reviewed. The risks due to aseptic loosening, infection, stiffness, patella tracking problems, thromboembolic complications and other imponderables were discussed. The patient acknowledged the explanation, agreed to proceed with the plan and consent was signed. Patient is being admitted for inpatient treatment for surgery, pain control, PT, OT, prophylactic antibiotics, VTE prophylaxis, progressive ambulation and ADL's and discharge planning. The patient is planning to be discharged home with OPPT    Anticipated LOS equal to or greater than 2 midnights due to - Age 63 and older with one or more of the following:  - Obesity  - Expected need for hospital services (PT, OT, Nursing) required for safe  discharge  - Anticipated need for postoperative skilled nursing care or inpatient rehab  - Active co-morbidities: hx breast cancer, diabetes, HTN, HLD, cardiac murmur, NAFL, hx DKA, hx kidney stones, hx gastroparesis, migraines OR   -  Unanticipated findings during/Post Surgery: None  - Patient is a high risk of re-admission due to: None

## 2024-03-25 NOTE — H&P (Signed)
 TOTAL KNEE ADMISSION H&P  Patient is being admitted for right total knee arthroplasty.  Subjective:  Chief Complaint:right knee pain.  HPI: 26 Banales, 63 y.o. female, has a history of pain and functional disability in the right knee due to arthritis and has failed non-surgical conservative treatments for greater than 12 weeks to includeNSAID's and/or analgesics, corticosteriod injections, flexibility and strengthening excercises, and activity modification.  Onset of symptoms was gradual, starting 2 years ago with gradually worsening course since that time. The patient noted no past surgery on the right knee(s).  Patient currently rates pain in the right knee(s) at 9 out of 10 with activity. Patient has night pain, worsening of pain with activity and weight bearing, pain that interferes with activities of daily living, and pain with passive range of motion.  Patient has evidence of periarticular osteophytes and joint space narrowing by imaging studies.  There is no active infection.  Patient Active Problem List   Diagnosis Date Noted   Nonspecific abnormal electrocardiogram (ECG) (EKG) 10/26/2020   Essential hypertension 10/26/2020   Breast cancer, left breast (HCC) 05/21/2014   Breast cancer of lower-outer quadrant of left female breast (HCC) 05/18/2014   STRICTURE AND STENOSIS OF ESOPHAGUS 06/28/2009   BARRETTS ESOPHAGUS 06/28/2009   ABDOMINAL PAIN, EPIGASTRIC 06/28/2009   CHEST PAIN UNSPECIFIED 04/28/2009   GERD 04/21/2009   Past Medical History:  Diagnosis Date   Anemia    low iron   Anxiety    Cancer (HCC)    left breast cancer - march, 2015   Depression    Diabetes mellitus without complication (HCC)    type 2   Diarrhea    Headache    migraines   History of hiatal hernia    Hypertension    Personal history of chemotherapy    PONV (postoperative nausea and vomiting)    also has difficulty waking up    Past Surgical History:  Procedure Laterality Date   ABDOMINAL  HYSTERECTOMY     BREAST RECONSTRUCTION Left 05/21/2014   DR BOWERS   BREAST SURGERY     breast reduction on right   CHOLECYSTECTOMY     COLONOSCOPY     ESOPHAGUS SURGERY     had stomach rewrapped around esophagus   KNEE ARTHROSCOPY Left    LATISSIMUS FLAP TO BREAST Left 05/21/2014   Procedure: LATISSIMUS FLAP TO LEFT BREAST WITH PLACEMENT OF IMPLANT FOR RECONSTRUCTION;  Surgeon: Alm Sick, MD;  Location: [facility] OR;  Service: Plastics;  Laterality: Left;   MASTECTOMY     MASTECTOMY Left 07/2013   REDUCTION MAMMAPLASTY Right    TONSILLECTOMY      Current Outpatient Medications  Medication Sig Dispense Refill Last Dose/Taking   benazepril  (LOTENSIN ) 20 MG tablet Take 1 tablet by mouth daily.      Calcium Carbonate-Vit D-Min (CALCIUM 1200 PO) Take by mouth daily.      Cholecalciferol (VITAMIN D3) 10 MCG (400 UNIT) tablet Take by mouth.      Coenzyme Q10 100 MG TABS Take by mouth.      Cyanocobalamin (B-12) 5000 MCG CAPS Take by mouth daily.      cyclobenzaprine  (FLEXERIL ) 10 MG tablet Take 1 tablet (10 mg total) by mouth 2 (two) times daily as needed for muscle spasms. 20 tablet 0    levocetirizine (XYZAL) 5 MG tablet Take by mouth.      Multiple Vitamin (MULTIVITAMIN) capsule Take 1 capsule by mouth daily.      omeprazole (PRILOSEC) 40 MG capsule Take  40 mg by mouth daily.      ondansetron  (ZOFRAN ) 4 MG tablet Take by mouth.      pravastatin (PRAVACHOL) 20 MG tablet Take by mouth.      pregabalin (LYRICA) 75 MG capsule Take 75 mg by mouth 2 (two) times daily.      sitaGLIPtin-metformin  (JANUMET) 50-1000 MG per tablet Take 1 tablet by mouth daily.      traZODone (DESYREL) 50 MG tablet Take by mouth.      venlafaxine  XR (EFFEXOR -XR) 150 MG 24 hr capsule Take 150 mg by mouth daily with breakfast.      No current facility-administered medications for this visit.   Allergies  Allergen Reactions   Tape Itching and Other (See Comments)    Adhesive (plastic tape) causes itching and skin to  peel    Social History   Tobacco Use   Smoking status: Never   Smokeless tobacco: Never  Substance Use Topics   Alcohol use: Yes    Comment: occasional    Family History  Problem Relation Age of Onset   Diabetes Mother    COPD Mother    CAD Mother    Heart attack Father 2   Breast cancer Paternal Aunt    Breast cancer Paternal Aunt    Breast cancer Paternal Aunt    Breast cancer Paternal Aunt      Review of Systems  Musculoskeletal:  Positive for arthralgias.  All other systems reviewed and are negative.   Objective:  Physical Exam Constitutional:      General: She is not in acute distress.    Appearance: Normal appearance. She is not ill-appearing.  HENT:     Head: Normocephalic and atraumatic.     Right Ear: External ear normal.     Left Ear: External ear normal.     Nose: Nose normal.     Mouth/Throat:     Mouth: Mucous membranes are moist.     Pharynx: Oropharynx is clear.  Eyes:     Extraocular Movements: Extraocular movements intact.     Conjunctiva/sclera: Conjunctivae normal.  Cardiovascular:     Rate and Rhythm: Normal rate.     Pulses: Normal pulses.  Pulmonary:     Effort: Pulmonary effort is normal.  Abdominal:     General: Bowel sounds are normal.     Palpations: Abdomen is soft.  Musculoskeletal:        General: Tenderness present.     Cervical back: Normal range of motion and neck supple.     Comments: TTP over medial and lateral joint line, medial worse than lateral.  No calf tenderness, swelling, or erythema.  No overlying lesions of area of chief complaint.  Decreased strength and ROM due to elicited pain.  Dorsiflexion and plantarflexion intact.  Stable to varus and valgus stress.  BLE appear grossly neurovascularly intact.  Gait mildly antalgic.   Skin:    General: Skin is warm and dry.  Neurological:     Mental Status: She is alert and oriented to person, place, and time. Mental status is at baseline.  Psychiatric:        Mood and  Affect: Mood normal.        Behavior: Behavior normal.     Vital signs in last 24 hours: @VSRANGES @  Labs:   Estimated body mass index is 29.85 kg/m as calculated from the following:   Height as of 10/28/20: 5' 2 (1.575 m).   Weight as of 10/28/20: 74 kg.  Imaging Review Plain radiographs demonstrate severe degenerative joint disease of the right knee(s). The overall alignment issignificant varus. The bone quality appears to be fair for age and reported activity level.      Assessment/Plan:  End stage arthritis, right knee   The patient history, physical examination, clinical judgment of the provider and imaging studies are consistent with end stage degenerative joint disease of the right knee(s) and total knee arthroplasty is deemed medically necessary. The treatment options including medical management, injection therapy arthroscopy and arthroplasty were discussed at length. The risks and benefits of total knee arthroplasty were presented and reviewed. The risks due to aseptic loosening, infection, stiffness, patella tracking problems, thromboembolic complications and other imponderables were discussed. The patient acknowledged the explanation, agreed to proceed with the plan and consent was signed. Patient is being admitted for inpatient treatment for surgery, pain control, PT, OT, prophylactic antibiotics, VTE prophylaxis, progressive ambulation and ADL's and discharge planning. The patient is planning to be discharged home with OPPT    Anticipated LOS equal to or greater than 2 midnights due to - Age 61 and older with one or more of the following:  - Obesity  - Expected need for hospital services (PT, OT, Nursing) required for safe  discharge  - Anticipated need for postoperative skilled nursing care or inpatient rehab  - Active co-morbidities: hx breast cancer, diabetes, HTN, HLD, cardiac murmur, NAFL, hx DKA, hx kidney stones, hx gastroparesis, migraines OR   -  Unanticipated findings during/Post Surgery: None  - Patient is a high risk of re-admission due to: None

## 2024-03-28 ENCOUNTER — Encounter: Payer: Self-pay | Admitting: Hematology and Oncology

## 2024-04-03 NOTE — Progress Notes (Addendum)
 PAT appointment done over phone. Patient coming in for labs 04/08/24.  Date of COVID positive in last 90 days:  PCP - Rosalva Clack, MD Cardiologist - Lynwood Schilling, MD saw in 2022 for abn EKG Endocrinologist- Heron Junk, NP  Medical clearance by Rosalva Catchings, MD 02/22/24 in Epic  PET- 12/12/23 [facility] Chest x-ray - N/A EKG - 02/22/24 [facility] Stress Test - 12/30/21 [facility] ECHO - N/A Cardiac Cath - N/A Pacemaker/ICD device last checked:N/A Spinal Cord Stimulator:N/A  Bowel Prep - N/A  Sleep Study - N/A CPAP -   Fasting Blood Sugar - 140-160 Checks Blood Sugar freestyle libre  Last dose of GLP1 agonist-  N/A GLP1 instructions:  Do not take after     Last dose of SGLT-2 inhibitors-  Jardiance  SGLT-2 instructions:  Do not take after  04/12/24   Blood Thinner Instructions: N/A Last dose:   Time: Aspirin Instructions:N/A Last Dose:  Activity level: Can go up a flight of stairs and perform activities of daily living without stopping and without symptoms of chest pain or shortness of breath.   Anesthesia review: heart murmur, HTN, anemia, DM2  Patient denies shortness of breath, fever, cough and chest pain at PAT appointment  Patient verbalized understanding of instructions that were given to them at the PAT appointment. Patient was also instructed that they will need to review over the PAT instructions again at home before surgery.

## 2024-04-03 NOTE — Patient Instructions (Addendum)
 SURGICAL WAITING ROOM VISITATION  Patients having surgery or a procedure may have no more than 2 support people in the waiting area - these visitors may rotate.    Children under the age of 20 must have an adult with them who is not the patient.  Visitors with respiratory illnesses are discouraged from visiting and should remain at home.  If the patient needs to stay at the hospital during part of their recovery, the visitor guidelines for inpatient rooms apply. Pre-op nurse will coordinate an appropriate time for 1 support person to accompany patient in pre-op.  This support person may not rotate.    Please refer to the [facility] website for the visitor guidelines for Inpatients (after your surgery is over and you are in a regular room).    Your procedure is scheduled on: 04/16/24   Report to [facility] Main Entrance    Report to admitting at 8:50 AM   Call this number if you have problems the morning of surgery [phone]   Do not eat food :After Midnight.   After Midnight you may have the following liquids until 8:20 AM DAY OF SURGERY  Water Non-Citrus Juices (without pulp, NO RED-Apple, White grape, White cranberry) Black Coffee (NO MILK/CREAM OR CREAMERS, sugar ok)  Clear Tea (NO MILK/CREAM OR CREAMERS, sugar ok) regular and decaf                             Plain Jell-O (NO RED)                                           Fruit ices (not with fruit pulp, NO RED)                                     Popsicles (NO RED)                                                               Sports drinks like Gatorade (NO RED)     The day of surgery:  Drink ONE (1) Pre-Surgery G2 at 8:20 AM the morning of surgery. Drink in one sitting. Do not sip.  This drink was given to you during your hospital  pre-op appointment visit. Nothing else to drink after completing the  Pre-Surgery G2.          If you have questions, please contact your surgeon's office.   FOLLOW BOWEL  PREP AND ANY ADDITIONAL PRE OP INSTRUCTIONS YOU RECEIVED FROM YOUR SURGEON'S OFFICE!!!     Oral Hygiene is also important to reduce your risk of infection.                                    Remember - BRUSH YOUR TEETH THE MORNING OF SURGERY WITH YOUR REGULAR TOOTHPASTE  DENTURES WILL BE REMOVED PRIOR TO SURGERY PLEASE DO NOT APPLY Poly grip OR ADHESIVES!!!   Stop all vitamins and herbal supplements 7 days before surgery.  Take these medicines the morning of surgery with A SIP OF WATER: Flonase, Pantoprazole , Pravastatin, Effexor   DO NOT TAKE ANY ORAL DIABETIC MEDICATIONS DAY OF YOUR SURGERY  How to Manage Your Diabetes Before and After Surgery  Why is it important to control my blood sugar before and after surgery? Improving blood sugar levels before and after surgery helps healing and can limit problems. A way of improving blood sugar control is eating a healthy diet by:  Eating less sugar and carbohydrates  Increasing activity/exercise  Talking with your doctor about reaching your blood sugar goals High blood sugars (greater than 180 mg/dL) can raise your risk of infections and slow your recovery, so you will need to focus on controlling your diabetes during the weeks before surgery. Make sure that the doctor who takes care of your diabetes knows about your planned surgery including the date and location.  How do I manage my blood sugar before surgery? Check your blood sugar at least 4 times a day, starting 2 days before surgery, to make sure that the level is not too high or low. Check your blood sugar the morning of your surgery when you wake up and every 2 hours until you get to the Short Stay unit. If your blood sugar is less than 70 mg/dL, you will need to treat for low blood sugar: Do not take insulin . Treat a low blood sugar (less than 70 mg/dL) with  cup of clear juice (cranberry or apple), 4 glucose tablets, OR glucose gel. Recheck blood sugar in 15 minutes after  treatment (to make sure it is greater than 70 mg/dL). If your blood sugar is not greater than 70 mg/dL on recheck, call [phone] for further instructions. Report your blood sugar to the short stay nurse when you get to Short Stay.  If you are admitted to the hospital after surgery: Your blood sugar will be checked by the staff and you will probably be given insulin  after surgery (instead of oral diabetes medicines) to make sure you have good blood sugar levels. The goal for blood sugar control after surgery is 80-180 mg/dL.   WHAT DO I DO ABOUT MY DIABETES MEDICATION?  Do not take oral diabetes medicines (pills) the morning of surgery.  Hold Jardiance for 3 days. Do not take after 04/12/24.  THE DAY BEFORE SURGERY, take 50% of Lantus dose (7 units). Take only morning dose of Glipizide, no afternoon or evening dose.     THE MORNING OF SURGERY, do not take Lantus or Glipizide.  Reviewed and Endorsed by [facility] Patient Education Committee, August 2015                              You may not have any metal on your body including hair pins, jewelry, and body piercing             Do not wear make-up, lotions, powders, perfumes, or deodorant  Do not wear nail polish including gel and S&S, artificial/acrylic nails, or any other type of covering on natural nails including finger and toenails. If you have artificial nails, gel coating, etc. that needs to be removed by a nail salon please have this removed prior to surgery or surgery may need to be canceled/ delayed if the surgeon/ anesthesia feels like they are unable to be safely monitored.   Do not shave  48 hours prior to surgery.    Do not bring valuables to  the hospital. [facility] IS NOT             RESPONSIBLE   FOR VALUABLES.   Contacts, glasses, dentures or bridgework may not be worn into surgery.  DO NOT BRING YOUR HOME MEDICATIONS TO THE HOSPITAL. PHARMACY WILL DISPENSE MEDICATIONS LISTED ON YOUR MEDICATION LIST TO YOU  DURING YOUR ADMISSION IN THE HOSPITAL!    Patients discharged on the day of surgery will not be allowed to drive home.  Someone NEEDS to stay with you for the first 24 hours after anesthesia.              Please read over the following fact sheets you were given: IF YOU HAVE QUESTIONS ABOUT YOUR PRE-OP INSTRUCTIONS PLEASE CALL [phone].   If you received a COVID test during your pre-op visit  it is requested that you wear a mask when out in public, stay away from anyone that may not be feeling well and notify your surgeon if you develop symptoms. If you test positive for Covid or have been in contact with anyone that has tested positive in the last 10 days please notify you surgeon.      Pre-operative 4 CHG Bath Instructions  DYNA-Hex 4 Chlorhexidine Gluconate 4% Solution Antiseptic 4 fl. oz   You can play a key role in reducing the risk of infection after surgery. Your skin needs to be as free of germs as possible. You can reduce the number of germs on your skin by washing with CHG (chlorhexidine gluconate) soap before surgery. CHG is an antiseptic soap that kills germs and continues to kill germs even after washing.   DO NOT use if you have an allergy to chlorhexidine/CHG or antibacterial soaps. If your skin becomes reddened or irritated, stop using the CHG and notify one of our RNs at   Please shower with the CHG soap starting 4 days before surgery using the following schedule:     Please keep in mind the following:  DO NOT shave, including legs and underarms, starting the day of your first shower.   You may shave your face at any point before/day of surgery.  Place clean sheets on your bed the day you start using CHG soap. Use a clean washcloth (not used since being washed) for each shower. DO NOT sleep with pets once you start using the CHG.  CHG Shower Instructions:  If you choose to wash your hair and private area, wash first with your normal shampoo/soap.  After you  use shampoo/soap, rinse your hair and body thoroughly to remove shampoo/soap residue.  Turn the water OFF and apply about 3 tablespoons (45 ml) of CHG soap to a CLEAN washcloth.  Apply CHG soap ONLY FROM YOUR NECK DOWN TO YOUR TOES (washing for 3-5 minutes)  DO NOT use CHG soap on face, private areas, open wounds, or sores.  Pay special attention to the area where your surgery is being performed.  If you are having back surgery, having someone wash your back for you may be helpful. Wait 2 minutes after CHG soap is applied, then you may rinse off the CHG soap.  Pat dry with a clean towel  Put on clean clothes/pajamas   If you choose to wear lotion, please use ONLY the CHG-compatible lotions on the back of this paper.     Additional instructions for the day of surgery: DO NOT APPLY any lotions, deodorants, cologne, or perfumes.   Put on clean/comfortable clothes.  Brush your  teeth.  Ask your nurse before applying any prescription medications to the skin.   CHG Compatible Lotions   Aveeno Moisturizing lotion  Cetaphil Moisturizing Cream  Cetaphil Moisturizing Lotion  Clairol Herbal Essence Moisturizing Lotion, Dry Skin  Clairol Herbal Essence Moisturizing Lotion, Extra Dry Skin  Clairol Herbal Essence Moisturizing Lotion, Normal Skin  Curel Age Defying Therapeutic Moisturizing Lotion with Alpha Hydroxy  Curel Extreme Care Body Lotion  Curel Soothing Hands Moisturizing Hand Lotion  Curel Therapeutic Moisturizing Cream, Fragrance-Free  Curel Therapeutic Moisturizing Lotion, Fragrance-Free  Curel Therapeutic Moisturizing Lotion, Original Formula  Eucerin Daily Replenishing Lotion  Eucerin Dry Skin Therapy Plus Alpha Hydroxy Crme  Eucerin Dry Skin Therapy Plus Alpha Hydroxy Lotion  Eucerin Original Crme  Eucerin Original Lotion  Eucerin Plus Crme Eucerin Plus Lotion  Eucerin TriLipid Replenishing Lotion  Keri Anti-Bacterial Hand Lotion  Keri Deep Conditioning Original Lotion Dry  Skin Formula Softly Scented  Keri Deep Conditioning Original Lotion, Fragrance Free Sensitive Skin Formula  Keri Lotion Fast Absorbing Fragrance Free Sensitive Skin Formula  Keri Lotion Fast Absorbing Softly Scented Dry Skin Formula  Keri Original Lotion  Keri Skin Renewal Lotion Keri Silky Smooth Lotion  Keri Silky Smooth Sensitive Skin Lotion  Nivea Body Creamy Conditioning Oil  Nivea Body Extra Enriched Teacher, Adult Education Moisturizing Lotion Nivea Crme  Nivea Skin Firming Lotion  NutraDerm 30 Skin Lotion  NutraDerm Skin Lotion  NutraDerm Therapeutic Skin Cream  NutraDerm Therapeutic Skin Lotion  ProShield Protective Hand Cream  Provon moisturizing lotion  View Pre-Surgery Education Videos:  [email or web address]    WHAT IS A BLOOD TRANSFUSION? Blood Transfusion Information  A transfusion is the replacement of blood or some of its parts. Blood is made up of multiple cells which provide different functions. Red blood cells carry oxygen and are used for blood loss replacement. White blood cells fight against infection. Platelets control bleeding. Plasma helps clot blood. Other blood products are available for specialized needs, such as hemophilia or other clotting disorders. BEFORE THE TRANSFUSION  Who gives blood for transfusions?  Healthy volunteers who are fully evaluated to make sure their blood is safe. This is blood bank blood. Transfusion therapy is the safest it has ever been in the practice of medicine. Before blood is taken from a donor, a complete history is taken to make sure that person has no history of diseases nor engages in risky social behavior (examples are intravenous drug use or sexual activity with multiple partners). The donor's travel history is screened to minimize risk of transmitting infections, such as malaria. The donated blood is tested for signs of infectious  diseases, such as HIV and hepatitis. The blood is then tested to be sure it is compatible with you in order to minimize the chance of a transfusion reaction. If you or a relative donates blood, this is often done in anticipation of surgery and is not appropriate for emergency situations. It takes many days to process the donated blood. RISKS AND COMPLICATIONS Although transfusion therapy is very safe and saves many lives, the main dangers of transfusion include:  Getting an infectious disease. Developing a transfusion reaction. This is an allergic reaction to something in the blood you were given. Every precaution is taken to prevent this. The decision to have a blood transfusion has been considered carefully by your caregiver before blood is given. Blood is not given unless the benefits outweigh the risks. AFTER THE TRANSFUSION Right after receiving a blood transfusion,  you will usually feel much better and more energetic. This is especially true if your red blood cells have gotten low (anemic). The transfusion raises the level of the red blood cells which carry oxygen, and this usually causes an energy increase. The nurse administering the transfusion will monitor you carefully for complications. HOME CARE INSTRUCTIONS  No special instructions are needed after a transfusion. You may find your energy is better. Speak with your caregiver about any limitations on activity for underlying diseases you may have. SEEK MEDICAL CARE IF:  Your condition is not improving after your transfusion. You develop redness or irritation at the intravenous (IV) site. SEEK IMMEDIATE MEDICAL CARE IF:  Any of the following symptoms occur over the next 12 hours: Shaking chills. You have a temperature by mouth above 102 F (38.9 C), not controlled by medicine. Chest, back, or muscle pain. People around you feel you are not acting correctly or are confused. Shortness of breath or difficulty breathing. Dizziness and  fainting. You get a rash or develop hives. You have a decrease in urine output. Your urine turns a dark color or changes to pink, red, or brown. Any of the following symptoms occur over the next 10 days: You have a temperature by mouth above 102 F (38.9 C), not controlled by medicine. Shortness of breath. Weakness after normal activity. The white part of the eye turns yellow (jaundice). You have a decrease in the amount of urine or are urinating less often. Your urine turns a dark color or changes to pink, red, or brown. Document Released: 04/14/2000 Document Revised: 07/10/2011 Document Reviewed: 12/02/2007 [facility] Patient Information 2014 [place], [place].  _______________________________________________________________________

## 2024-04-04 ENCOUNTER — Encounter (HOSPITAL_COMMUNITY)
Admission: RE | Admit: 2024-04-04 | Discharge: 2024-04-04 | Disposition: A | Source: Ambulatory Visit | Attending: Orthopedic Surgery | Admitting: Orthopedic Surgery

## 2024-04-04 ENCOUNTER — Encounter (HOSPITAL_COMMUNITY): Payer: Self-pay

## 2024-04-04 ENCOUNTER — Other Ambulatory Visit: Payer: Self-pay

## 2024-04-04 DIAGNOSIS — E119 Type 2 diabetes mellitus without complications: Secondary | ICD-10-CM

## 2024-04-04 DIAGNOSIS — Z01818 Encounter for other preprocedural examination: Secondary | ICD-10-CM

## 2024-04-04 HISTORY — DX: Cardiac murmur, unspecified: R01.1

## 2024-04-04 HISTORY — DX: Personal history of urinary calculi: Z87.442

## 2024-04-04 HISTORY — DX: Unspecified osteoarthritis, unspecified site: M19.90

## 2024-04-08 ENCOUNTER — Encounter (HOSPITAL_COMMUNITY)
Admission: RE | Admit: 2024-04-08 | Discharge: 2024-04-08 | Disposition: A | Discharge status: Home / Self Care | Source: Ambulatory Visit | Attending: Orthopedic Surgery | Admitting: Orthopedic Surgery

## 2024-04-08 DIAGNOSIS — E119 Type 2 diabetes mellitus without complications: Secondary | ICD-10-CM

## 2024-04-08 DIAGNOSIS — Z01818 Encounter for other preprocedural examination: Secondary | ICD-10-CM

## 2024-04-08 DIAGNOSIS — G8929 Other chronic pain: Secondary | ICD-10-CM

## 2024-04-08 LAB — COMPREHENSIVE METABOLIC PANEL WITH GFR
ALT: 16 U/L (ref 0–44)
AST: 22 U/L (ref 15–41)
Albumin: 4.3 g/dL (ref 3.5–5.0)
Alkaline Phosphatase: 68 U/L (ref 38–126)
Anion gap: 12 (ref 5–15)
BUN: 8 mg/dL (ref 8–23)
CO2: 26 mmol/L (ref 22–32)
Calcium: 9.5 mg/dL (ref 8.9–10.3)
Chloride: 103 mmol/L (ref 98–111)
Creatinine, Ser: 0.55 mg/dL (ref 0.44–1.00)
GFR, Estimated: >60 mL/min (ref >60)
Glucose, Bld: 136 mg/dL — ABNORMAL HIGH (ref 70–99)
Potassium: 3.8 mmol/L (ref 3.5–5.1)
Sodium: 141 mmol/L (ref 135–145)
Total Bilirubin: 0.8 mg/dL (ref 0.0–1.2)
Total Protein: 7.1 g/dL (ref 6.5–8.1)

## 2024-04-08 LAB — CBC WITH DIFFERENTIAL/PLATELET
Abs Immature Granulocytes: 0.01 K/uL (ref 0.00–0.07)
Basophils Absolute: 0 K/uL (ref 0.0–0.1)
Basophils Relative: 0 %
Eosinophils Absolute: 0.1 K/uL (ref 0.0–0.5)
Eosinophils Relative: 1 %
HCT: 41.6 % (ref 36.0–46.0)
Hemoglobin: 14.7 g/dL (ref 12.0–15.0)
Immature Granulocytes: 0 %
Lymphocytes Relative: 34 %
Lymphs Abs: 1.6 K/uL (ref 0.7–4.0)
MCH: 32.7 pg (ref 26.0–34.0)
MCHC: 35.3 g/dL (ref 30.0–36.0)
MCV: 92.4 fL (ref 80.0–100.0)
Monocytes Absolute: 0.3 K/uL (ref 0.1–1.0)
Monocytes Relative: 6 %
Neutro Abs: 2.8 K/uL (ref 1.7–7.7)
Neutrophils Relative %: 59 %
Platelets: 156 K/uL (ref 150–400)
RBC: 4.5 MIL/uL (ref 3.87–5.11)
RDW: 12.3 % (ref 11.5–15.5)
WBC: 4.7 K/uL (ref 4.0–10.5)
nRBC: 0 % (ref 0.0–0.2)

## 2024-04-08 LAB — GLUCOSE, CAPILLARY: Glucose-Capillary: 136 mg/dL — ABNORMAL HIGH (ref 70–99)

## 2024-04-08 LAB — SURGICAL PCR SCREEN
MRSA, PCR: NEGATIVE
Staphylococcus aureus: NEGATIVE

## 2024-04-09 NOTE — Progress Notes (Signed)
 Case: [id number] Date/Time: 04/16/24 1056   Procedure: ARTHROPLASTY, KNEE, TOTAL (Right: Knee)   Anesthesia type: Spinal   Pre-op diagnosis: OA RIGHT KNEE   Location: [facility] ROOM 08 / [facility]   Surgeons: Edna Toribio LABOR, MD       DISCUSSION: Katherine Acosta is a 63 yo female with PMH of HTN, coronary calcifications, headaches, GERD, hiatal hernia, T2DM (A1c 6.0), L breast cancer s/p mastectomy and chemo (2015), anxiety, depression, arthritis.  Prior anesthesia complications include PONV and prolonged emergence.  Seen by PCP at [facility] for medical clearance for surgery. Per PA Podraza: She is low-moderate medical risk for moderate risk surgical procedure; stable w/ her clinical baseline and optimized for surgery from an internal medicine perspective.  She follows with endocrine for her DM. She takes insulin  and has a CGM. A1c was 6.0 on 02/26/24  LD Jardiance 12/13  VS:     04/08/2024    8:48 AM 10/28/2020    8:19 AM 08/31/2017    8:00 AM  Vitals with BMI  Height 5' 2 5' 2 5' 2  Weight 165 lbs 163 lbs 3 oz 175 lbs 2 oz  BMI 30.17 29.84 32.02  Systolic 138 132 879  Diastolic 88 70 74  Pulse 79 87 83     PROVIDERS: Yasmin Sauer, MD   LABS: Labs reviewed: Acceptable for surgery. (all labs ordered are listed, but only abnormal results are displayed)  Labs Reviewed - No data to display   IMAGES:   EKG 02/22/24 ([facility]):  Sinus rhythm Normal ECG When compared with ECG of 29-Dec-2021 19 26, Premature ventricular complexes are no longer present  Exercise stress test 01/05/2021:    No ST deviation was noted.   Normal ETT with PVCls during stress and recovery Normal hemodynamic response  CT calcium score 10/2720:  IMPRESSION: Coronary calcium score of 13.5. This was 69th percentile for age-, race-, and sex-matched controls.  Past Medical History:  Diagnosis Date   Anemia    low iron   Anxiety    Arthritis    Cancer (HCC)    left breast cancer - march, 2015    Depression    Diabetes mellitus without complication (HCC)    type 2   Diarrhea    DKA (diabetic ketoacidosis) (HCC) 2024   Headache    migraines   Heart murmur    History of hiatal hernia    History of kidney stones    Hypertension    Personal history of chemotherapy    PONV (postoperative nausea and vomiting)    also has difficulty waking up    Past Surgical History:  Procedure Laterality Date   ABDOMINAL HYSTERECTOMY     BREAST RECONSTRUCTION Left 05/21/2014   DR BOWERS   BREAST SURGERY     breast reduction on right   CESAREAN SECTION     x2   CHOLECYSTECTOMY     COLONOSCOPY     CYSTOSCOPY     ESOPHAGUS SURGERY     had stomach rewrapped around esophagus   KNEE ARTHROSCOPY Left    LATISSIMUS FLAP TO BREAST Left 05/21/2014   Procedure: LATISSIMUS FLAP TO LEFT BREAST WITH PLACEMENT OF IMPLANT FOR RECONSTRUCTION;  Surgeon: Alm Sick, MD;  Location: [facility] OR;  Service: Plastics;  Laterality: Left;   MASTECTOMY     MASTECTOMY Left 07/2013   REDUCTION MAMMAPLASTY Right    TONSILLECTOMY      MEDICATIONS:  aspirin EC 81 MG tablet   benazepril  (LOTENSIN ) 10 MG  tablet   Calcium Carbonate-Vit D-Min (CALCIUM 1200 PO)   Cholecalciferol (VITAMIN D3) 50 MCG (2000 UT) TABS   Coenzyme Q10 100 MG TABS   Cyanocobalamin (B-12) 5000 MCG CAPS   cyclobenzaprine  (FLEXERIL ) 10 MG tablet   fluticasone (FLONASE) 50 MCG/ACT nasal spray   glipiZIDE (GLUCOTROL XL) 2.5 MG 24 hr tablet   JARDIANCE 25 MG TABS tablet   LANTUS SOLOSTAR 100 UNIT/ML Solostar Pen   metoCLOPramide (REGLAN) 5 MG tablet   Multiple Vitamin (MULTIVITAMIN) capsule   nystatin cream (MYCOSTATIN)   pantoprazole  (PROTONIX ) 40 MG tablet   pravastatin (PRAVACHOL) 20 MG tablet   sucralfate (CARAFATE) 1 g tablet   traZODone (DESYREL) 50 MG tablet   venlafaxine  XR (EFFEXOR -XR) 75 MG 24 hr capsule   No current facility-administered medications for this encounter.   Burnard CHRISTELLA Odis DEVONNA [facility] Short  Stay/Anesthesiology [facility] Phone [phone] 04/09/2024 11:29 AM

## 2024-04-09 NOTE — Anesthesia Preprocedure Evaluation (Addendum)
 Anesthesia Evaluation  Patient identified by MRN, date of birth, ID band Patient awake    Reviewed: Allergy & Precautions, NPO status , Patient's Chart, lab work & pertinent test results  History of Anesthesia Complications (+) PONV and history of anesthetic complications  Airway Mallampati: II  TM Distance: >3 FB Neck ROM: Full    Dental  (+) Dental Advisory Given   Pulmonary neg pulmonary ROS   Pulmonary exam normal        Cardiovascular hypertension, Pt. on medications Normal cardiovascular exam     Neuro/Psych  Headaches PSYCHIATRIC DISORDERS Anxiety Depression       GI/Hepatic Neg liver ROS, hiatal hernia,GERD  Medicated and Controlled,,  Endo/Other  diabetes, Type 2, Oral Hypoglycemic Agents, Insulin  Dependent   Obesity   Renal/GU negative Renal ROS     Musculoskeletal  (+) Arthritis ,    Abdominal   Peds  Hematology negative hematology ROS (+)  Plt 156k    Anesthesia Other Findings   Reproductive/Obstetrics                              Anesthesia Physical Anesthesia Plan  ASA: 3  Anesthesia Plan: Spinal   Post-op Pain Management: Tylenol  PO (pre-op)* and Regional block*   Induction:   PONV Risk Score and Plan: 3 and Treatment may vary due to age or medical condition, Propofol  infusion and Ondansetron   Airway Management Planned: Natural Airway and Simple Face Mask  Additional Equipment: None  Intra-op Plan:   Post-operative Plan:   Informed Consent: I have reviewed the patients History and Physical, chart, labs and discussed the procedure including the risks, benefits and alternatives for the proposed anesthesia with the patient or authorized representative who has indicated his/her understanding and acceptance.       Plan Discussed with: CRNA and Anesthesiologist  Anesthesia Plan Comments: (See PAT note from 12/5)         Anesthesia Quick  Evaluation

## 2024-04-16 ENCOUNTER — Other Ambulatory Visit: Payer: Self-pay

## 2024-04-16 ENCOUNTER — Observation Stay (HOSPITAL_COMMUNITY)
Admission: RE | Admit: 2024-04-16 | Discharge: 2024-04-17 | Disposition: A | Attending: Orthopedic Surgery | Admitting: Orthopedic Surgery

## 2024-04-16 ENCOUNTER — Ambulatory Visit (HOSPITAL_COMMUNITY): Admitting: Anesthesiology

## 2024-04-16 ENCOUNTER — Encounter (HOSPITAL_COMMUNITY): Admission: RE | Disposition: A | Payer: Self-pay | Source: Ambulatory Visit | Attending: Orthopedic Surgery

## 2024-04-16 ENCOUNTER — Observation Stay (HOSPITAL_COMMUNITY)

## 2024-04-16 ENCOUNTER — Encounter (HOSPITAL_COMMUNITY): Admitting: Medical

## 2024-04-16 ENCOUNTER — Encounter (HOSPITAL_COMMUNITY): Payer: Self-pay | Admitting: Orthopedic Surgery

## 2024-04-16 DIAGNOSIS — E119 Type 2 diabetes mellitus without complications: Secondary | ICD-10-CM | POA: Insufficient documentation

## 2024-04-16 DIAGNOSIS — I10 Essential (primary) hypertension: Secondary | ICD-10-CM

## 2024-04-16 DIAGNOSIS — Z853 Personal history of malignant neoplasm of breast: Secondary | ICD-10-CM | POA: Diagnosis not present

## 2024-04-16 DIAGNOSIS — Z7984 Long term (current) use of oral hypoglycemic drugs: Secondary | ICD-10-CM | POA: Insufficient documentation

## 2024-04-16 DIAGNOSIS — M1711 Unilateral primary osteoarthritis, right knee: Secondary | ICD-10-CM | POA: Diagnosis present

## 2024-04-16 DIAGNOSIS — Z79899 Other long term (current) drug therapy: Secondary | ICD-10-CM | POA: Insufficient documentation

## 2024-04-16 HISTORY — PX: TOTAL KNEE ARTHROPLASTY: SHX125

## 2024-04-16 LAB — GLUCOSE, CAPILLARY
Glucose-Capillary: 173 mg/dL — ABNORMAL HIGH (ref 70–99)
Glucose-Capillary: 113 mg/dL — ABNORMAL HIGH (ref 70–99)
Glucose-Capillary: 109 mg/dL — ABNORMAL HIGH (ref 70–99)
Glucose-Capillary: 120 mg/dL — ABNORMAL HIGH (ref 70–99)
Glucose-Capillary: 177 mg/dL — ABNORMAL HIGH (ref 70–99)

## 2024-04-16 LAB — TYPE AND SCREEN
ABO/RH(D): B POS
Antibody Screen: NEGATIVE

## 2024-04-16 LAB — ABO/RH: ABO/RH(D): B POS

## 2024-04-16 SURGERY — ARTHROPLASTY, KNEE, TOTAL
Anesthesia: Spinal | Site: Knee | Laterality: Right

## 2024-04-16 MED ORDER — ONDANSETRON HCL 4 MG/2ML IJ SOLN
4.0000 mg | Freq: Once | INTRAMUSCULAR | Status: AC | PRN
  Administered 2024-04-16: 14:00:00 4 mg via INTRAVENOUS

## 2024-04-16 MED ORDER — PANTOPRAZOLE SODIUM 40 MG PO TBEC
40.0000 mg | DELAYED_RELEASE_TABLET | Freq: Every day | ORAL | Status: DC
  Administered 2024-04-16 – 2024-04-17 (×2): 40 mg via ORAL
  Filled 2024-04-16 (×2): qty 1

## 2024-04-16 MED ORDER — SUCRALFATE 1 G PO TABS
1.0000 g | ORAL_TABLET | Freq: Three times a day (TID) | ORAL | Status: DC
  Administered 2024-04-16 – 2024-04-17 (×2): 1 g via ORAL
  Filled 2024-04-16 (×2): qty 1

## 2024-04-16 MED ORDER — HYDROMORPHONE HCL 1 MG/ML IJ SOLN
0.5000 mg | INTRAMUSCULAR | Status: DC | PRN
  Administered 2024-04-16: 15:00:00 1 mg via INTRAVENOUS

## 2024-04-16 MED ORDER — ACETAMINOPHEN 500 MG PO TABS: 1000.0000 mg | ORAL_TABLET | Freq: Once | ORAL | Status: DC

## 2024-04-16 MED ORDER — POLYETHYLENE GLYCOL 3350 17 G PO PACK: 17.0000 g | PACK | Freq: Every day | ORAL | 0 refills | Status: AC

## 2024-04-16 MED ORDER — BUPIVACAINE-EPINEPHRINE (PF) 0.25% -1:200000 IJ SOLN
INTRAMUSCULAR | Status: DC | PRN
  Administered 2024-04-16: 12:00:00 80 mL

## 2024-04-16 MED ORDER — OXYCODONE HCL 5 MG PO TABS: 5.0000 mg | ORAL_TABLET | Freq: Once | ORAL | Status: DC | PRN

## 2024-04-16 MED ORDER — METHOCARBAMOL 500 MG PO TABS
500.0000 mg | ORAL_TABLET | Freq: Four times a day (QID) | ORAL | Status: DC | PRN
  Administered 2024-04-16 – 2024-04-17 (×2): 500 mg via ORAL
  Filled 2024-04-16 (×2): qty 1

## 2024-04-16 MED ORDER — ONDANSETRON HCL 4 MG/2ML IJ SOLN
4.0000 mg | Freq: Once | INTRAMUSCULAR | Status: AC
  Administered 2024-04-16: 09:00:00 4 mg via INTRAVENOUS

## 2024-04-16 MED ORDER — TRAZODONE HCL 50 MG PO TABS: 50.0000 mg | ORAL_TABLET | Freq: Every evening | ORAL | Status: DC | PRN

## 2024-04-16 MED ORDER — CEFAZOLIN SODIUM-DEXTROSE 2-4 GM/100ML-% IV SOLN
2.0000 g | INTRAVENOUS | Status: AC
  Administered 2024-04-16: 11:00:00 2 g via INTRAVENOUS
  Filled 2024-04-16: qty 100

## 2024-04-16 MED ORDER — FENTANYL CITRATE (PF) 50 MCG/ML IJ SOSY
50.0000 ug | PREFILLED_SYRINGE | INTRAMUSCULAR | Status: DC
  Administered 2024-04-16: 11:00:00 50 ug via INTRAVENOUS
  Filled 2024-04-16: qty 2

## 2024-04-16 MED ORDER — OXYCODONE HCL 5 MG PO TABS: 5.0000 mg | ORAL_TABLET | ORAL | 0 refills | Status: AC | PRN

## 2024-04-16 MED ORDER — LACTATED RINGERS IV BOLUS: 250.0000 mL | Freq: Once | INTRAVENOUS | Status: DC

## 2024-04-16 MED ORDER — INSULIN ASPART 100 UNIT/ML IJ SOLN
0.0000 [IU] | INTRAMUSCULAR | Status: DC | PRN
  Administered 2024-04-16: 09:00:00 2 [IU] via SUBCUTANEOUS

## 2024-04-16 MED ORDER — DEXAMETHASONE SOD PHOSPHATE PF 10 MG/ML IJ SOLN: 4.0000 mg | Freq: Once | INTRAMUSCULAR | Status: DC

## 2024-04-16 MED ORDER — LACTATED RINGERS IV SOLN: INTRAVENOUS | Status: DC

## 2024-04-16 MED ORDER — OXYCODONE HCL 5 MG PO TABS
5.0000 mg | ORAL_TABLET | ORAL | Status: DC | PRN
  Administered 2024-04-16 (×2): 5 mg via ORAL
  Administered 2024-04-17 (×2): 10 mg via ORAL
  Filled 2024-04-16: qty 1
  Filled 2024-04-16: qty 2
  Filled 2024-04-16: qty 1
  Filled 2024-04-16: qty 2

## 2024-04-16 MED ORDER — GLIPIZIDE ER 5 MG PO TB24
7.5000 mg | ORAL_TABLET | Freq: Two times a day (BID) | ORAL | Status: DC
  Administered 2024-04-17: 09:00:00 7.5 mg via ORAL
  Filled 2024-04-16: qty 1

## 2024-04-16 MED ORDER — INSULIN GLARGINE-YFGN 100 UNIT/ML ~~LOC~~ SOLN
15.0000 [IU] | Freq: Every day | SUBCUTANEOUS | Status: DC
  Administered 2024-04-16: 22:00:00 15 [IU] via SUBCUTANEOUS
  Filled 2024-04-16 (×2): qty 0.15

## 2024-04-16 MED ORDER — 0.9 % SODIUM CHLORIDE (POUR BTL) OPTIME
TOPICAL | Status: DC | PRN
  Administered 2024-04-16: 12:00:00 1000 mL

## 2024-04-16 MED ORDER — INSULIN ASPART 100 UNIT/ML IJ SOLN
0.0000 [IU] | Freq: Three times a day (TID) | INTRAMUSCULAR | Status: DC
  Administered 2024-04-17: 09:00:00 2 [IU] via SUBCUTANEOUS
  Filled 2024-04-16: qty 2

## 2024-04-16 MED ORDER — OXYCODONE HCL 5 MG/5ML PO SOLN: 5.0000 mg | Freq: Once | ORAL | Status: DC | PRN

## 2024-04-16 MED ORDER — PROPOFOL 500 MG/50ML IV EMUL
INTRAVENOUS | Status: DC | PRN
  Administered 2024-04-16: 11:00:00 60 mg via INTRAVENOUS
  Administered 2024-04-16: 12:00:00 110 ug/kg/min via INTRAVENOUS
  Administered 2024-04-16: 11:00:00 115 ug/kg/min via INTRAVENOUS

## 2024-04-16 MED ORDER — ACETAMINOPHEN 500 MG PO TABS
1000.0000 mg | ORAL_TABLET | Freq: Once | ORAL | Status: AC
  Administered 2024-04-16: 09:00:00 1000 mg via ORAL
  Filled 2024-04-16: qty 2

## 2024-04-16 MED ORDER — DIPHENHYDRAMINE HCL 12.5 MG/5ML PO ELIX
12.5000 mg | ORAL_SOLUTION | ORAL | Status: DC | PRN
  Administered 2024-04-16: 22:00:00 25 mg via ORAL
  Filled 2024-04-16: qty 10

## 2024-04-16 MED ORDER — BUPIVACAINE LIPOSOME 1.3 % IJ SUSP
INTRAMUSCULAR | Status: AC
  Filled 2024-04-16: qty 20

## 2024-04-16 MED ORDER — SODIUM CHLORIDE (PF) 0.9 % IJ SOLN
INTRAMUSCULAR | Status: AC
  Filled 2024-04-16: qty 30

## 2024-04-16 MED ORDER — ORAL CARE MOUTH RINSE: 15.0000 mL | Freq: Once | OROMUCOSAL | Status: AC

## 2024-04-16 MED ORDER — POLYETHYLENE GLYCOL 3350 17 G PO PACK: 17.0000 g | PACK | Freq: Every day | ORAL | Status: DC | PRN

## 2024-04-16 MED ORDER — METHOCARBAMOL 1000 MG/10ML IJ SOLN: 500.0000 mg | Freq: Four times a day (QID) | INTRAMUSCULAR | Status: DC | PRN

## 2024-04-16 MED ORDER — LACTATED RINGERS IV BOLUS
500.0000 mL | Freq: Once | INTRAVENOUS | Status: AC
  Administered 2024-04-16: 15:00:00 500 mL via INTRAVENOUS

## 2024-04-16 MED ORDER — FENTANYL CITRATE (PF) 50 MCG/ML IJ SOSY: 25.0000 ug | PREFILLED_SYRINGE | INTRAMUSCULAR | Status: DC | PRN

## 2024-04-16 MED ORDER — ONDANSETRON HCL 4 MG/2ML IJ SOLN
INTRAMUSCULAR | Status: AC
  Filled 2024-04-16: qty 2

## 2024-04-16 MED ORDER — HYDROMORPHONE HCL 1 MG/ML IJ SOLN
INTRAMUSCULAR | Status: AC
  Filled 2024-04-16: qty 1

## 2024-04-16 MED ORDER — PHENYLEPHRINE HCL-NACL 20-0.9 MG/250ML-% IV SOLN
INTRAVENOUS | Status: DC | PRN
  Administered 2024-04-16: 12:00:00 25 ug/min via INTRAVENOUS

## 2024-04-16 MED ORDER — TRANEXAMIC ACID-NACL 1000-0.7 MG/100ML-% IV SOLN
1000.0000 mg | INTRAVENOUS | Status: AC
  Administered 2024-04-16: 11:00:00 1000 mg via INTRAVENOUS
  Filled 2024-04-16: qty 100

## 2024-04-16 MED ORDER — PRAVASTATIN SODIUM 20 MG PO TABS
20.0000 mg | ORAL_TABLET | Freq: Every day | ORAL | Status: DC
  Administered 2024-04-17: 10:00:00 20 mg via ORAL
  Filled 2024-04-16: qty 1

## 2024-04-16 MED ORDER — CEFAZOLIN SODIUM-DEXTROSE 2-4 GM/100ML-% IV SOLN
2.0000 g | Freq: Four times a day (QID) | INTRAVENOUS | Status: AC
  Administered 2024-04-16 – 2024-04-17 (×2): 2 g via INTRAVENOUS
  Filled 2024-04-16 (×2): qty 100

## 2024-04-16 MED ORDER — ACETAMINOPHEN 500 MG PO TABS
1000.0000 mg | ORAL_TABLET | Freq: Four times a day (QID) | ORAL | Status: DC
  Administered 2024-04-16 – 2024-04-17 (×3): 1000 mg via ORAL
  Filled 2024-04-16 (×3): qty 2

## 2024-04-16 MED ORDER — EMPAGLIFLOZIN 25 MG PO TABS
25.0000 mg | ORAL_TABLET | Freq: Every day | ORAL | Status: DC
  Administered 2024-04-17: 10:00:00 25 mg via ORAL
  Filled 2024-04-16: qty 1

## 2024-04-16 MED ORDER — ACETAMINOPHEN 325 MG PO TABS: 325.0000 mg | ORAL_TABLET | Freq: Four times a day (QID) | ORAL | Status: DC | PRN

## 2024-04-16 MED ORDER — ONDANSETRON HCL 4 MG/2ML IJ SOLN: 4.0000 mg | Freq: Four times a day (QID) | INTRAMUSCULAR | Status: DC | PRN

## 2024-04-16 MED ORDER — VENLAFAXINE HCL ER 75 MG PO CP24
75.0000 mg | ORAL_CAPSULE | Freq: Every day | ORAL | Status: DC
  Administered 2024-04-17: 09:00:00 75 mg via ORAL
  Filled 2024-04-16: qty 1

## 2024-04-16 MED ORDER — WATER FOR IRRIGATION, STERILE IR SOLN
Status: DC | PRN
  Administered 2024-04-16: 12:00:00 2000 mL

## 2024-04-16 MED ORDER — DOCUSATE SODIUM 100 MG PO CAPS
100.0000 mg | ORAL_CAPSULE | Freq: Two times a day (BID) | ORAL | Status: DC
  Administered 2024-04-16 – 2024-04-17 (×2): 100 mg via ORAL
  Filled 2024-04-16 (×2): qty 1

## 2024-04-16 MED ORDER — FLUTICASONE PROPIONATE 50 MCG/ACT NA SUSP: 2.0000 | Freq: Every day | NASAL | Status: DC | PRN

## 2024-04-16 MED ORDER — ASPIRIN 81 MG PO CHEW
81.0000 mg | CHEWABLE_TABLET | Freq: Two times a day (BID) | ORAL | Status: DC
  Administered 2024-04-16 – 2024-04-17 (×2): 81 mg via ORAL
  Filled 2024-04-16 (×2): qty 1

## 2024-04-16 MED ORDER — ONDANSETRON HCL 4 MG PO TABS: 4.0000 mg | ORAL_TABLET | Freq: Three times a day (TID) | ORAL | 0 refills | Status: AC | PRN

## 2024-04-16 MED ORDER — POVIDONE-IODINE 10 % EX SWAB: 2.0000 | Freq: Once | CUTANEOUS | Status: DC

## 2024-04-16 MED ORDER — METOCLOPRAMIDE HCL 5 MG PO TABS
5.0000 mg | ORAL_TABLET | Freq: Three times a day (TID) | ORAL | Status: DC
  Administered 2024-04-16 – 2024-04-17 (×2): 5 mg via ORAL
  Filled 2024-04-16 (×2): qty 1

## 2024-04-16 MED ORDER — SODIUM CHLORIDE 0.9 % IR SOLN
Status: DC | PRN
  Administered 2024-04-16: 12:00:00 3000 mL

## 2024-04-16 MED ORDER — ROPIVACAINE HCL 7.5 MG/ML IJ SOLN
INTRAMUSCULAR | Status: DC | PRN
  Administered 2024-04-16: 11:00:00 20 mL via PERINEURAL

## 2024-04-16 MED ORDER — BUPIVACAINE LIPOSOME 1.3 % IJ SUSP: 20.0000 mL | Freq: Once | INTRAMUSCULAR | Status: DC

## 2024-04-16 MED ORDER — MENTHOL 3 MG MT LOZG: 1.0000 | LOZENGE | OROMUCOSAL | Status: DC | PRN

## 2024-04-16 MED ORDER — BENAZEPRIL HCL 20 MG PO TABS
10.0000 mg | ORAL_TABLET | Freq: Every day | ORAL | Status: DC
  Administered 2024-04-17: 10:00:00 10 mg via ORAL
  Filled 2024-04-16: qty 1

## 2024-04-16 MED ORDER — BUPIVACAINE IN DEXTROSE 0.75-8.25 % IT SOLN
INTRATHECAL | Status: DC | PRN
  Administered 2024-04-16: 11:00:00 1.6 mL via INTRATHECAL

## 2024-04-16 MED ORDER — CHLORHEXIDINE GLUCONATE 0.12 % MT SOLN
15.0000 mL | Freq: Once | OROMUCOSAL | Status: AC
  Administered 2024-04-16: 09:00:00 15 mL via OROMUCOSAL

## 2024-04-16 MED ORDER — MIDAZOLAM HCL (PF) 2 MG/2ML IJ SOLN
2.0000 mg | INTRAMUSCULAR | Status: DC
  Filled 2024-04-16: qty 2

## 2024-04-16 MED ORDER — KETOROLAC TROMETHAMINE 15 MG/ML IJ SOLN
7.5000 mg | Freq: Four times a day (QID) | INTRAMUSCULAR | Status: DC
  Administered 2024-04-16 – 2024-04-17 (×3): 7.5 mg via INTRAVENOUS
  Filled 2024-04-16 (×3): qty 1

## 2024-04-16 MED ORDER — BUPIVACAINE-EPINEPHRINE (PF) 0.25% -1:200000 IJ SOLN
INTRAMUSCULAR | Status: AC
  Filled 2024-04-16: qty 30

## 2024-04-16 MED ORDER — SODIUM CHLORIDE 0.9 % IV SOLN: INTRAVENOUS | Status: DC

## 2024-04-16 MED ORDER — ONDANSETRON HCL 4 MG PO TABS: 4.0000 mg | ORAL_TABLET | Freq: Four times a day (QID) | ORAL | Status: DC | PRN

## 2024-04-16 MED ORDER — PROPOFOL 1000 MG/100ML IV EMUL
INTRAVENOUS | Status: AC
  Filled 2024-04-16: qty 100

## 2024-04-16 MED ORDER — PHENOL 1.4 % MT LIQD: 1.0000 | OROMUCOSAL | Status: DC | PRN

## 2024-04-16 MED ORDER — ISOPROPYL ALCOHOL 70 % SOLN
Status: DC | PRN
  Administered 2024-04-16: 12:00:00 1 via TOPICAL

## 2024-04-16 MED ORDER — INSULIN ASPART 100 UNIT/ML IJ SOLN
INTRAMUSCULAR | Status: AC
  Filled 2024-04-16: qty 2

## 2024-04-16 MED ORDER — CELECOXIB 100 MG PO CAPS: 100.0000 mg | ORAL_CAPSULE | Freq: Two times a day (BID) | ORAL | 0 refills | Status: AC

## 2024-04-16 SURGICAL SUPPLY — 48 items
BAG COUNTER SPONGE SURGICOUNT (BAG) IMPLANT
BLADE SAG 18X100X1.27 (BLADE) ×1 IMPLANT
BLADE SAW SAG 35X64 .89 (BLADE) ×1 IMPLANT
BLADE SAW SGTL 11.0X1.19X90.0M (BLADE) IMPLANT
BNDG COHESIVE 3X5 TAN ST LF (GAUZE/BANDAGES/DRESSINGS) ×1 IMPLANT
BNDG ELASTIC 6X10 VLCR STRL LF (GAUZE/BANDAGES/DRESSINGS) ×1 IMPLANT
BOWL SMART MIX CTS (DISPOSABLE) IMPLANT
CHLORAPREP W/TINT 26 (MISCELLANEOUS) ×2 IMPLANT
COMPONENT FEM KNEE PS NRW 6 RT (Joint) IMPLANT
COMPONENT PATELLA 3 PEG 29X9 (Joint) IMPLANT
COMPONENT TIB KNEE PS C 0D RT (Knees) IMPLANT
COVER SURGICAL LIGHT HANDLE (MISCELLANEOUS) ×1 IMPLANT
CUFF TRNQT CYL 34X4.125X (TOURNIQUET CUFF) ×1 IMPLANT
DERMABOND ADVANCED .7 DNX12 (GAUZE/BANDAGES/DRESSINGS) ×2 IMPLANT
DRAPE INCISE IOBAN 85X60 (DRAPES) ×1 IMPLANT
DRAPE SHEET LG 3/4 BI-LAMINATE (DRAPES) ×1 IMPLANT
DRAPE U-SHAPE 47X51 STRL (DRAPES) ×1 IMPLANT
DRSG AQUACEL AG ADV 3.5X10 (GAUZE/BANDAGES/DRESSINGS) ×1 IMPLANT
ELECT REM PT RETURN 15FT ADLT (MISCELLANEOUS) ×1 IMPLANT
GAUZE SPONGE 4X4 12PLY STRL (GAUZE/BANDAGES/DRESSINGS) ×1 IMPLANT
GLOVE BIO SURGEON STRL SZ 6.5 (GLOVE) ×2 IMPLANT
GLOVE BIOGEL PI IND STRL 6.5 (GLOVE) ×1 IMPLANT
GLOVE BIOGEL PI IND STRL 8 (GLOVE) ×1 IMPLANT
GLOVE SURG ORTHO 8.0 STRL STRW (GLOVE) ×2 IMPLANT
GOWN STRL REUS W/ TWL XL LVL3 (GOWN DISPOSABLE) ×2 IMPLANT
HOLDER FOLEY CATH W/STRAP (MISCELLANEOUS) ×1 IMPLANT
HOOD PEEL AWAY T7 (MISCELLANEOUS) ×3 IMPLANT
KIT TURNOVER KIT A (KITS) ×1 IMPLANT
LINER ASF PERS 10X6/7 CD RT (Liner) IMPLANT
MANIFOLD NEPTUNE II (INSTRUMENTS) ×1 IMPLANT
MARKER SKIN DUAL TIP RULER LAB (MISCELLANEOUS) ×1 IMPLANT
NS IRRIG 1000ML POUR BTL (IV SOLUTION) ×1 IMPLANT
PACK TOTAL KNEE CUSTOM (KITS) ×1 IMPLANT
PENCIL SMOKE EVACUATOR (MISCELLANEOUS) ×1 IMPLANT
PIN DRILL HDLS TROCAR 75 4PK (PIN) IMPLANT
SCREW HEADED 33MM KNEE (MISCELLANEOUS) IMPLANT
SET HNDPC FAN SPRY TIP SCT (DISPOSABLE) ×1 IMPLANT
SOLUTION IRRIG SURGIPHOR (IV SOLUTION) IMPLANT
SOLUTION PRONTOSAN WOUND 350ML (IRRIGATION / IRRIGATOR) IMPLANT
STRIP CLOSURE SKIN 1/2X4 (GAUZE/BANDAGES/DRESSINGS) ×1 IMPLANT
SUT MNCRL AB 3-0 PS2 18 (SUTURE) ×1 IMPLANT
SUT STRATAFIX 14 PDO 48 VLT (SUTURE) ×1 IMPLANT
SUT VIC AB 2-0 CT2 27 (SUTURE) ×2 IMPLANT
SUTURE STRATFX 0 PDS 27 VIOLET (SUTURE) ×1 IMPLANT
TRAY FOLEY MTR SLVR 14FR STAT (SET/KITS/TRAYS/PACK) IMPLANT
TUBE SUCTION HIGH CAP CLEAR NV (SUCTIONS) ×1 IMPLANT
UNDERPAD 30X36 HEAVY ABSORB (UNDERPADS AND DIAPERS) ×1 IMPLANT
WRAP KNEE MAXI GEL POST OP (GAUZE/BANDAGES/DRESSINGS) ×1 IMPLANT

## 2024-04-16 NOTE — Plan of Care (Signed)
   Problem: Education: Goal: Knowledge of General Education information will improve Description: Including pain rating scale, medication(s)/side effects and non-pharmacologic comfort measures Outcome: Progressing   Problem: Nutrition: Goal: Adequate nutrition will be maintained Outcome: Progressing   Problem: Safety: Goal: Ability to remain free from injury will improve Outcome: Progressing

## 2024-04-16 NOTE — Op Note (Signed)
 DATE OF SURGERY:  04/16/2024 TIME: 12:36 PM  PATIENT NAME:  Katherine Acosta   AGE: 63 y.o.    PRE-OPERATIVE DIAGNOSIS: End-stage right knee osteoarthritis  POST-OPERATIVE DIAGNOSIS:  Same  PROCEDURE: Press-fit right total Knee Arthroplasty  SURGEON:  Terina Mcelhinny A Lashannon Bresnan, MD   ASSISTANT: Bernarda Mclean, PA-C, present and scrubbed throughout the case, critical for assistance with exposure, retraction, instrumentation, and closure.   OPERATIVE IMPLANTS:  Press-fit Zimmer persona size 6 narrow CR femur, right C tibial baseplate 29 mm three-peg press-fit patella, 10 mm MC poly insert Implant Name Type Inv. Item Serial No. Manufacturer Lot No. LRB No. Used Action  COMPONENT FEM KNEE PS NRW 6 RT - [id number] Joint COMPONENT FEM KNEE PS NRW 6 RT  [employer]IMMER [employer](ORTH,TRAU,BIO,SG) [id number] Right 1 Implanted  COMPONENT PATELLA 3 PEG 29X9 - [id number] Joint COMPONENT PATELLA 3 PEG 29X9  [employer](ORTH,TRAU,BIO,SG) [id number] Right 1 Implanted  COMPONENT TIB KNEE PS C 0D RT - [id number] Knees COMPONENT TIB KNEE PS C 0D RT  ZIMMER [employer]ON(ORTH,TRAU,BIO,SG) [id number] Right 1 Implanted  LINER ASF PERS 10X6/7 CD RT - [id number] Liner LINER ASF PERS 10X6/7 CD RT  Z[employer]MER [employer](ORTH,TRAU,BIO,SG) [id number] Right 1 Implanted      PREOPERATIVE INDICATIONS:  Katherine Acosta is a 63 y.o. year old female with end stage bone on bone degenerative arthritis of the knee who failed conservative treatment, including injections, antiinflammatories, activity modification, and assistive devices, and had significant impairment of their activities of daily living, and elected for Total Knee Arthroplasty.   The risks, benefits, and alternatives were discussed at length including but not limited to the risks of infection, bleeding, nerve injury, stiffness, blood clots, the need for revision surgery, cardiopulmonary complications, among others, and they were willing to proceed.  ESTIMATED BLOOD LOSS: 50cc  OPERATIVE  DESCRIPTION:   Once adequate anesthesia was induced, preoperative antibiotics, 2 gm of ancef ,1 gm of Tranexamic Acid , and 8 mg of Decadron  administered, the patient was positioned supine with a right thigh tourniquet placed.  The right lower extremity was prepped and draped in sterile fashion.  A time-  out was performed identifying the patient, planned procedure, and the appropriate extremity.     The leg was  exsanguinated, tourniquet elevated to 250 mmHg.  A midline incision was  made followed by median parapatellar arthrotomy. Anterior horn of the medial meniscus was released and resected. A medial release was performed, the infrapatellar fat pad was resected with care taken to protect the patellar tendon. The suprapatellar fat was removed to exposed the distal anterior femur. The anterior horn of the lateral meniscus and ACL were released.    Following initial  exposure, I first started with the femur  The femoral  canal was opened with a drill, canal was suctioned to try to prevent fat emboli.  An  intramedullary rod was passed set at 5 degrees valgus, 10.5 mm. The distal femur was resected.  Following this resection, the tibia was  subluxated anteriorly.  Using the extramedullary guide, 10 mm of bone was resected off   the proximal lateral tibia.  We confirmed the gap would be  stable medially and laterally with a size 10mm spacer block as well as confirmed that the tibial cut was perpendicular in the coronal plane, checking with an alignment rod.    Once this was done, the posterior femoral referencing femoral sizer was placed under to the posterior condyles with 0 degrees of external rotational which was parallel to the transepicondylar axis and perpendicular to  Whitesides line. The femur was sized to be a size 6 in the anterior-  posterior dimension. The  anterior, posterior, and  chamfer cuts were made without difficulty nor   notching making certain that I was along the anterior cortex to help   with flexion gap stability. Next a laminar spreader was placed with the knee in flexion and the medial lateral menisci were resected.  5 cc of the Exparel  mixture was injected in the medial side of the back of the knee and 3 cc in the lateral side.  1/2 inch curved osteotome was used to resect posterior osteophyte that was then removed with a pituitary rongeur.       At this point, the tibia was sized to be a size C.  The size C tray was  then pinned in position. Trial reduction was now carried with a 6 femur, C tibia, a 10 mm MC insert.  The knee had full extension and was stable to varus valgus stress in extension.  The knee was stable in flexion and the PCL was left intact  Attention was next directed to the patella.  Precut  measurement was noted to be 19 mm.  I resected down to 13 mm and used a  29mm patellar button to restore patellar height as well as cover the cut surface.     The patella lug holes were drilled and a 29 mm patella poly trial was placed.    The knee was brought to full extension with good flexion stability with the patella tracking through the trochlea without application of pressure.    Next the femoral component was again assessed and determined to be seated and appropriately lateralized.  The femoral lug holes were drilled.  The femoral component was then removed. Tibial component was again assessed and felt to be seated and appropriately rotated with the medial third of the tubercle. The tibia was then drilled, and keel punched.     Final components were  opened and impacted into place.   The knee was irrigated with sterile Betadine  diluted in saline as well as pulse lavage normal saline. The synovial lining was  then injected a dilute Exparel  with 30cc of 0.25% marcaine  with epinephrine .     I confirmed that I was satisfied with the range of motion and stability, and the final 10 mm MC poly insert was chosen.  It was placed into the knee.         The tourniquet had  been let down.  No significant hemostasis was required.  The medial parapatellar arthrotomy was then reapproximated using #1 Stratafix sutures with the knee  in flexion.  The remaining wound was closed with 0 stratafix, 2-0 Vicryl, and running 3-0 Monocryl. The knee was cleaned, dried, dressed sterilely using Dermabond and   Aquacel dressing.  The patient was then brought to recovery room in stable condition, tolerating the procedure  well. There were no complications.   Post op recs: WB: WBAT Abx: ancef  Imaging: PACU xrays DVT prophylaxis: Aspirin  81mg  BID x4 weeks Follow up: 2 weeks after surgery for a wound check with Dr. Edna at [facility].  Address: [address], [place], [place] [address]  Office Phone: [phone]  Toribio Edna, MD Orthopaedic Surgery

## 2024-04-16 NOTE — Anesthesia Procedure Notes (Signed)
 Anesthesia Regional Block: Adductor canal block   Pre-Anesthetic Checklist: , timeout performed,  Correct Patient, Correct Site, Correct Laterality,  Correct Procedure, Correct Position, site marked,  Risks and benefits discussed,  Surgical consent,  Pre-op evaluation,  At surgeon's request and post-op pain management  Laterality: Right  Prep: chloraprep       Needles:  Injection technique: Single-shot  Needle Type: Echogenic Needle     Needle Length: 10cm  Needle Gauge: 21     Additional Needles:   Narrative:  Start time: 04/16/2024 10:55 AM End time: 04/16/2024 10:58 AM Injection made incrementally with aspirations every 5 mL.  Performed by: Personally  Anesthesiologist: Lucious Debby BRAVO, MD  Additional Notes: No pain on injection. No increased resistance to injection. Injection made in 5cc increments. Good needle visualization. Patient tolerated the procedure well.

## 2024-04-16 NOTE — Anesthesia Postprocedure Evaluation (Signed)
 Anesthesia Post Note  Patient: Katherine Acosta  Procedure(s) Performed: ARTHROPLASTY, KNEE, TOTAL (Right: Knee)     Patient location during evaluation: PACU Anesthesia Type: Spinal Level of consciousness: awake and alert Pain management: pain level controlled Vital Signs Assessment: post-procedure vital signs reviewed and stable Respiratory status: spontaneous breathing, nonlabored ventilation and respiratory function stable Cardiovascular status: stable and blood pressure returned to baseline Postop Assessment: spinal receding, no apparent nausea or vomiting and no headache Anesthetic complications: no   No notable events documented.  Last Vitals:  Vitals:   04/16/24 1415 04/16/24 1430  BP: (!) 144/85 (!) 146/85  Pulse: 74 77  Resp: 15 20  Temp:    SpO2: 99% 100%    Last Pain:  Vitals:   04/16/24 1430  TempSrc:   PainSc: 0-No pain                 Debby FORBES Like

## 2024-04-16 NOTE — Transfer of Care (Signed)
 Immediate Anesthesia Transfer of Care Note  Patient: Katherine Acosta  Procedure(s) Performed: ARTHROPLASTY, KNEE, TOTAL (Right: Knee)  Patient Location: PACU  Anesthesia Type:MAC and Spinal  Level of Consciousness: oriented and drowsy  Airway & Oxygen Therapy: Patient Spontanous Breathing  Post-op Assessment: Report given to RN and Post -op Vital signs reviewed and stable  Post vital signs: Reviewed and stable  Last Vitals:  Vitals Value Taken Time  BP 138/75   Temp 97.3   Pulse 65   Resp 14   SpO2 92     Last Pain:  Vitals:   04/16/24 0904  TempSrc: Oral  PainSc: 0-No pain         Complications: No notable events documented.

## 2024-04-16 NOTE — Discharge Instructions (Signed)

## 2024-04-16 NOTE — Evaluation (Signed)
 Physical Therapy Evaluation Patient Details Name: Katherine Acosta MRN: [id number] DOB: 11-18-1960 Today's Date: 04/16/2024  History of Present Illness  63 yo female presents to therapy s/p R TKA on 04/16/2024 due to failure of conservative measures. Pt PMH includes but is not limited to: HTN, L ba ca s/p mastectomy, syncope, osteopenia, thrombocytopenia,  esophageal stricture, epigastric pain, angina, GERD, anemia, anxiety, depression, DM II hx of DAK, HA, and hernia.  Clinical Impression      Katherine Acosta is a 63 y.o. female POD 0 s/p R TKA. Patient reports IND with mobility at baseline. Patient is now limited by functional impairments (see PT problem list below) and requires CGA for supine to sit and min A for R LE for sit to supine for bed mobility and min A for transfers. Patient was able to side step/ambulate 3 feet with RW and min A level of assist. Patient instructed in exercise to facilitate ROM and circulation to manage edema. Patient will benefit from continued skilled PT interventions to address impairments and progress towards PLOF. Acute PT will follow to progress mobility and stair training in preparation for safe discharge home with family support and OPPT services.    If plan is discharge home, recommend the following: A little help with walking and/or transfers;A little help with bathing/dressing/bathroom;Assist for transportation;Help with stairs or ramp for entrance;Assistance with cooking/housework   Can travel by private Tax Inspector (2 wheels) (youth)  Recommendations for Other Services       Functional Status Assessment Patient has had a recent decline in their functional status and demonstrates the ability to make significant improvements in function in a reasonable and predictable amount of time.     Precautions / Restrictions Precautions Precautions: Fall;Knee Restrictions Weight Bearing Restrictions Per Provider Order:  Yes RLE Weight Bearing Per Provider Order: Weight bearing as tolerated      Mobility  Bed Mobility Overal bed mobility: Needs Assistance Bed Mobility: Supine to Sit, Sit to Supine     Supine to sit: Contact guard, HOB elevated, Used rails Sit to supine: Min assist   General bed mobility comments: min A for return to flat bed    Transfers Overall transfer level: Needs assistance Equipment used: Rolling walker (2 wheels) Transfers: Sit to/from Stand Sit to Stand: Min assist           General transfer comment: min cues    Ambulation/Gait Ambulation/Gait assistance: Editor, Commissioning (Feet): 3 Feet Assistive device: Rolling walker (2 wheels) Gait Pattern/deviations: Step-to pattern, Antalgic, Trunk flexed Gait velocity: decreased     General Gait Details: pt limited with gait tasks due to reports of increasing pain and able to side step to the R with min A and Rw  Stairs            Wheelchair Mobility     Tilt Bed    Modified Rankin (Stroke Patients Only)       Balance Overall balance assessment: Needs assistance Sitting-balance support: Feet supported Sitting balance-Leahy Scale: Good     Standing balance support: Bilateral upper extremity supported, During functional activity, Reliant on assistive device for balance Standing balance-Leahy Scale: Poor                               Pertinent Vitals/Pain Pain Assessment Pain Assessment: 0-10 Pain Score: 8  Pain Location: R knee and LE, at rest  pain 6/10 Pain Descriptors / Indicators: Aching, Constant, Discomfort, Dull, Grimacing, Operative site guarding Pain Intervention(s): Limited activity within patient's tolerance, Monitored during session, Premedicated before session, Repositioned, Patient requesting pain meds-RN notified, RN gave pain meds during session, Ice applied    Home Living Family/patient expects to be discharged to:: Private residence Living Arrangements:  Spouse/significant other Available Help at Discharge: Family Type of Home: Mobile home Home Access: Stairs to enter Entrance Stairs-Rails: Right Entrance Stairs-Number of Steps: 2   Home Layout: One level Home Equipment: None      Prior Function Prior Level of Function : Independent/Modified Independent;Driving             Mobility Comments: IND no AD for all ADLs, self care tasks and IADLa       Extremity/Trunk Assessment        Lower Extremity Assessment Lower Extremity Assessment: RLE deficits/detail RLE Deficits / Details: ankle DF/PF 5/5; SLR < 10 degree lag RLE Sensation: WNL    Cervical / Trunk Assessment Cervical / Trunk Assessment: Normal  Communication   Communication Communication: No apparent difficulties    Cognition Arousal: Alert Behavior During Therapy: WFL for tasks assessed/performed   PT - Cognitive impairments: No apparent impairments                         Following commands: Intact       Cueing       General Comments      Exercises Total Joint Exercises Ankle Circles/Pumps: AROM, Both, 10 reps   Assessment/Plan    PT Assessment Patient needs continued PT services  PT Problem List Decreased strength;Decreased range of motion;Decreased activity tolerance;Decreased balance;Decreased coordination;Decreased mobility;Pain       PT Treatment Interventions DME instruction;Gait training;Stair training;Functional mobility training;Therapeutic activities;Therapeutic exercise;Balance training;Neuromuscular re-education;Patient/family education;Modalities    PT Goals (Current goals can be found in the Care Plan section)  Acute Rehab PT Goals Patient Stated Goal: to be able to run and play iwth grandchild, get up/down from ground and garden PT Goal Formulation: With patient Time For Goal Achievement: 04/30/24 Potential to Achieve Goals: Good    Frequency 7X/week     Co-evaluation               AM-PAC PT 6  Clicks Mobility  Outcome Measure Help needed turning from your back to your side while in a flat bed without using bedrails?: None Help needed moving from lying on your back to sitting on the side of a flat bed without using bedrails?: A Little Help needed moving to and from a bed to a chair (including a wheelchair)?: A Little Help needed standing up from a chair using your arms (e.g., wheelchair or bedside chair)?: A Little Help needed to walk in hospital room?: Total Help needed climbing 3-5 steps with a railing? : Total 6 Click Score: 15    End of Session Equipment Utilized During Treatment: Gait belt Activity Tolerance: Patient limited by pain Patient left: in bed;with call bell/phone within reach;with nursing/sitter in room;with family/visitor present Nurse Communication: Mobility status;Patient requests pain meds PT Visit Diagnosis: Unsteadiness on feet (R26.81);Other abnormalities of gait and mobility (R26.89);Muscle weakness (generalized) (M62.81);Difficulty in walking, not elsewhere classified (R26.2);Pain Pain - Right/Left: Right Pain - part of body: Knee;Leg    Time: 1741-1810 PT Time Calculation (min) (ACUTE ONLY): 29 min   Charges:   PT Evaluation $PT Eval Low Complexity: 1 Low PT Treatments $Therapeutic Activity: 8-22 mins PT General  Charges $$ ACUTE PT VISIT: 1 Visit         [place], PT Acute Rehab   [place] VEAR Drone 04/16/2024, 6:55 PM

## 2024-04-16 NOTE — Plan of Care (Signed)

## 2024-04-16 NOTE — Progress Notes (Signed)
 Orthopedic Tech Progress Note Patient Details:  Katherine Acosta August 09, 1960 [id number]  Ortho Devices Type of Ortho Device: Bone foam zero knee Ortho Device/Splint Location: right Ortho Device/Splint Interventions: Ordered, Application, Adjustment   Post Interventions Patient Tolerated: Well Instructions Provided: Adjustment of device, Care of device  Waylan Thom Loving 04/16/2024, 3:34 PM

## 2024-04-16 NOTE — Interval H&P Note (Signed)

## 2024-04-16 NOTE — Anesthesia Procedure Notes (Signed)
 Spinal  Patient location during procedure: OR Start time: 04/16/2024 11:13 AM End time: 04/16/2024 11:16 AM Reason for block: surgical anesthesia  Staffing Performed: anesthesiologist  Authorized by: Lucious Debby BRAVO, MD   Performed by: Lucious Debby BRAVO, MD  Preanesthetic Checklist Completed: patient identified, IV checked, risks and benefits discussed, surgical consent, monitors and equipment checked, pre-op evaluation and timeout performed Spinal Block Patient position: sitting Prep: DuraPrep Patient monitoring: heart rate, cardiac monitor, continuous pulse ox and blood pressure Approach: midline Location: L3-4 Injection technique: single-shot Needle Needle type: Pencan  Needle gauge: 24 G  Additional Notes Consent was obtained prior to the procedure with all questions answered and concerns addressed. Risks including, but not limited to, bleeding, infection, nerve damage, paralysis, failed block, inadequate analgesia, allergic reaction, high spinal, itching, and headache were discussed and the patient wished to proceed. Functioning IV was confirmed and monitors were applied. Sterile prep and drape, including hand hygiene, mask, and sterile gloves were used. The patient was positioned and the spine was prepped. The skin was anesthetized with lidocaine . Free flow of clear CSF was obtained prior to injecting local anesthetic into the CSF. The spinal needle aspirated freely following injection. The needle was carefully withdrawn. The patient tolerated the procedure well.   Debby Lucious, MD

## 2024-04-16 NOTE — Progress Notes (Signed)
Pt c/o nausea.  

## 2024-04-17 ENCOUNTER — Encounter (HOSPITAL_COMMUNITY): Payer: Self-pay | Admitting: Orthopedic Surgery

## 2024-04-17 DIAGNOSIS — M1711 Unilateral primary osteoarthritis, right knee: Secondary | ICD-10-CM | POA: Diagnosis not present

## 2024-04-17 LAB — BASIC METABOLIC PANEL WITH GFR
Anion gap: 10 (ref 5–15)
BUN: 5 mg/dL — ABNORMAL LOW (ref 8–23)
CO2: 26 mmol/L (ref 22–32)
Calcium: 8.7 mg/dL — ABNORMAL LOW (ref 8.9–10.3)
Chloride: 101 mmol/L (ref 98–111)
Creatinine, Ser: 0.51 mg/dL (ref 0.44–1.00)
GFR, Estimated: >60 mL/min (ref >60)
Glucose, Bld: 186 mg/dL — ABNORMAL HIGH (ref 70–99)
Potassium: 3.6 mmol/L (ref 3.5–5.1)
Sodium: 137 mmol/L (ref 135–145)

## 2024-04-17 LAB — CBC
HCT: 37.3 % (ref 36.0–46.0)
Hemoglobin: 12.9 g/dL (ref 12.0–15.0)
MCH: 32.2 pg (ref 26.0–34.0)
MCHC: 34.6 g/dL (ref 30.0–36.0)
MCV: 93 fL (ref 80.0–100.0)
Platelets: 125 K/uL — ABNORMAL LOW (ref 150–400)
RBC: 4.01 MIL/uL (ref 3.87–5.11)
RDW: 12.1 % (ref 11.5–15.5)
WBC: 6.5 K/uL (ref 4.0–10.5)
nRBC: 0 % (ref 0.0–0.2)

## 2024-04-17 LAB — GLUCOSE, CAPILLARY: Glucose-Capillary: 148 mg/dL — ABNORMAL HIGH (ref 70–99)

## 2024-04-17 LAB — HEMOGLOBIN A1C
Hgb A1c MFr Bld: 6 % — ABNORMAL HIGH (ref 4.8–5.6)
Mean Plasma Glucose: 125.5 mg/dL

## 2024-04-17 NOTE — Progress Notes (Signed)
 Physical Therapy Treatment Patient Details Name: Katherine Acosta MRN: [id number] DOB: July 23, 1960 Today's Date: 04/17/2024   History of Present Illness 63 yo female presents to therapy s/p R TKA on 04/16/2024 due to failure of conservative measures. Pt PMH includes but is not limited to: HTN, L ba ca s/p mastectomy, syncope, osteopenia, thrombocytopenia,  esophageal stricture, epigastric pain, angina, GERD, anemia, anxiety, depression, DM II hx of DAK, HA, and hernia.    PT Comments  The patient reports 4/10 pain. Patient able to progress mobility and ambulatedx60'. PT will return  to practice steps and HEP.   If plan is discharge home, recommend the following: A little help with walking and/or transfers;A little help with bathing/dressing/bathroom;Assist for transportation;Help with stairs or ramp for entrance;Assistance with cooking/housework   Can travel by Training And Development Officer (2 wheels)    Recommendations for Other Services       Precautions / Restrictions Precautions Precautions: Fall;Knee Restrictions RLE Weight Bearing Per Provider Order: Weight bearing as tolerated     Mobility  Bed Mobility Overal bed mobility: Needs Assistance Bed Mobility: Supine to Sit, Sit to Supine     Supine to sit: Contact guard, HOB elevated     General bed mobility comments: pt. manages the  RLE    Transfers Overall transfer level: Needs assistance Equipment used: Rolling walker (2 wheels) Transfers: Sit to/from Stand Sit to Stand: Contact guard assist           General transfer comment: cues for hand and  right leg position,    Ambulation/Gait Ambulation/Gait assistance: Contact guard assist Gait Distance (Feet): 15 Feet (then 60') Assistive device: Rolling walker (2 wheels) Gait Pattern/deviations: Step-to pattern, Antalgic, Trunk flexed Gait velocity: decreased     General Gait Details: cues for sequence. patient moves very  slowly.   Stairs             Wheelchair Mobility     Tilt Bed    Modified Rankin (Stroke Patients Only)       Balance Overall balance assessment: Needs assistance Sitting-balance support: Feet supported Sitting balance-Leahy Scale: Good     Standing balance support: Bilateral upper extremity supported, During functional activity, Reliant on assistive device for balance Standing balance-Leahy Scale: Fair                              Hotel Manager: No apparent difficulties  Cognition Arousal: Alert Behavior During Therapy: WFL for tasks assessed/performed   PT - Cognitive impairments: No apparent impairments                         Following commands: Intact      Cueing    Exercises      General Comments        Pertinent Vitals/Pain Pain Assessment Pain Score: 4  Pain Location: R knee and LE, Pain Descriptors / Indicators: Aching, Constant, Discomfort, Dull, Grimacing, Operative site guarding Pain Intervention(s): Monitored during session, Premedicated before session, Ice applied    Home Living                          Prior Function            PT Goals (current goals can now be found in the care plan section) Progress towards PT goals: Progressing toward goals  Frequency    7X/week      PT Plan      Co-evaluation              AM-PAC PT 6 Clicks Mobility   Outcome Measure  Help needed turning from your back to your side while in a flat bed without using bedrails?: None Help needed moving from lying on your back to sitting on the side of a flat bed without using bedrails?: A Little Help needed moving to and from a bed to a chair (including a wheelchair)?: A Little Help needed standing up from a chair using your arms (e.g., wheelchair or bedside chair)?: A Little Help needed to walk in hospital room?: A Little Help needed climbing 3-5 steps with a railing? : A  Little 6 Click Score: 19    End of Session Equipment Utilized During Treatment: Gait belt Activity Tolerance: Patient tolerated treatment well Patient left: with call bell/phone within reach;with family/visitor present;in chair;with chair alarm set Nurse Communication: Mobility status;Patient requests pain meds PT Visit Diagnosis: Unsteadiness on feet (R26.81);Other abnormalities of gait and mobility (R26.89);Muscle weakness (generalized) (M62.81);Difficulty in walking, not elsewhere classified (R26.2);Pain Pain - Right/Left: Right Pain - part of body: Knee;Leg     Time: 9090-9058 PT Time Calculation (min) (ACUTE ONLY): 32 min  Charges:    $Gait Training: 8-22 mins $Self Care/Home Management: 8-22 PT General Charges $$ ACUTE PT VISIT: 1 Visit         Darice Potters PT Acute Rehabilitation Services Office [phone]  Potters Darice Norris 04/17/2024, 1:57 PM

## 2024-04-17 NOTE — TOC Transition Note (Addendum)
 Transition of Care [facility]) - Discharge Note   Patient Details  Name: Katherine Acosta MRN: [id number] Date of Birth: 26-Apr-1961  Transition of Care [facility]) CM/SW Contact:  Alfonse JONELLE Rex, RN Phone Number: 04/17/2024, 9:51 AM   Clinical Narrative:   Met with patient/spouse at bedside to review dc therapy and home equipment needs, confirmed OPPT, Youth RW delivered to bedside by [employer]. No INPT CM needs . MOON completed.     Final next level of care: OP Rehab Barriers to Discharge: No Barriers Identified   Patient Goals and CMS Choice Patient states their goals for this hospitalization and ongoing recovery are:: return home          Discharge Placement                       Discharge Plan and Services Additional resources added to the After Visit Summary for                                       Social Drivers of Health (SDOH) Interventions SDOH Screenings   Food Insecurity: No Food Insecurity (04/16/2024)  Housing: Low Risk (04/16/2024)  Transportation Needs: No Transportation Needs (04/16/2024)  Utilities: Not At Risk (04/16/2024)  Financial Resource Strain: Low Risk (04/12/2022)   Received from [facility]  Social Connections: Moderately Isolated (04/16/2024)  Stress: No Stress Concern Present (03/24/2023)   Received from [facility]  Tobacco Use: Low Risk (04/16/2024)     Readmission Risk Interventions     No data to display

## 2024-04-17 NOTE — Care Management Obs Status (Signed)
 MEDICARE OBSERVATION STATUS NOTIFICATION   Patient Details  Name: Loucille Takach MRN: [id number] Date of Birth: October 01, 1960   Medicare Observation Status Notification Given:  Yes    Alfonse JONELLE Rex, RN 04/17/2024, 10:52 AM

## 2024-04-17 NOTE — Progress Notes (Signed)
° ° ° °  Subjective:  Patient reports pain as mild.  She took a few steps with physical therapy yesterday.  Doing well overall.  Plan for discharge home later today if she is able to progress well with physical therapy.  Yesterday's total administered Morphine  Milligram Equivalents: 50   Objective:   VITALS:   Vitals:   04/16/24 1840 04/16/24 2224 04/17/24 0215 04/17/24 0537  BP: 136/70 139/78 134/73 135/83  Pulse: 83 77 78 83  Resp: 15 15 16 15   Temp: 98.8 F (37.1 C) 98.1 F (36.7 C) 98.1 F (36.7 C) (!) 97.5 F (36.4 C)  TempSrc: Oral Oral Oral Oral  SpO2: 96% 97% 99% 99%  Weight:      Height:        Sensation intact distally Intact pulses distally Dorsiflexion/Plantar flexion intact Incision: dressing C/D/I Compartment soft    Lab Results  Component Value Date   WBC 6.5 04/17/2024   HGB 12.9 04/17/2024   HCT 37.3 04/17/2024   MCV 93.0 04/17/2024   PLT 125 (L) 04/17/2024   BMET    Component Value Date/Time   NA 137 04/17/2024 0349   NA 142 12/14/2016 0826   K 3.6 04/17/2024 0349   K 3.7 12/14/2016 0826   CL 101 04/17/2024 0349   CO2 26 04/17/2024 0349   CO2 25 12/14/2016 0826   GLUCOSE 186 (H) 04/17/2024 0349   GLUCOSE 165 (H) 12/14/2016 0826   BUN 5 (L) 04/17/2024 0349   BUN 6.4 (L) 12/14/2016 0826   CREATININE 0.51 04/17/2024 0349   CREATININE 0.75 06/14/2017 1423   CREATININE 0.7 12/14/2016 0826   CALCIUM 8.7 (L) 04/17/2024 0349   CALCIUM 9.5 12/14/2016 0826   EGFR >90 12/14/2016 0826   GFRNONAA >60 04/17/2024 0349   GFRNONAA >60 06/14/2017 1423     Xray: Total knee arthroplasty components in good position no adverse features  Assessment/Plan: 1 Day Post-Op   Principal Problem:   Localized osteoarthritis of right knee  Status post right TKA 04/16/2024  Post op recs: WB: WBAT Abx: ancef  Imaging: PACU xrays DVT prophylaxis: Aspirin  81mg  BID x4 weeks Follow up: 2 weeks after surgery for a wound check with Dr. Edna at [facility]  Address: [address], [place], [place] [address]  Office Phone: [phone]  TORIBIO DELENA EDNA 04/17/2024, 8:01 AM   Toribio Edna, MD  Contact information:   [phone] 7am-5pm epic message Dr. Edna, or call office for patient follow up: [phone] After hours and holidays please check [email or web address] for group call information for [facility]

## 2024-04-17 NOTE — Progress Notes (Signed)
 Physical Therapy Treatment Patient Details Name: Katherine Acosta MRN: [id number] DOB: 08-31-1960 Today's Date: 04/17/2024   History of Present Illness 63 yo female presents to therapy s/p R TKA on 04/16/2024 due to failure of conservative measures. Pt PMH includes but is not limited to: HTN, L ba ca s/p mastectomy, syncope, osteopenia, thrombocytopenia,  esophageal stricture, epigastric pain, angina, GERD, anemia, anxiety, depression, DM II hx of DAK, HA, and hernia.    PT Comments   The  patient appears mildly sleepy, slow to respond. Patient able to practice steps with spouse assisting, able to turn sideways with 1 rail available.  Patient has met PT goals for DC.    If plan is discharge home, recommend the following: A little help with walking and/or transfers;A little help with bathing/dressing/bathroom;Assist for transportation;Help with stairs or ramp for entrance;Assistance with cooking/housework   Can travel by Training And Development Officer (2 wheels)    Recommendations for Other Services       Precautions / Restrictions Precautions Precautions: Fall;Knee Restrictions RLE Weight Bearing Per Provider Order: Weight bearing as tolerated     Mobility  Bed Mobility Overal bed mobility: Needs Assistance Bed Mobility:     Supine to sit:      General bed mobility comments: in recliner    Transfers Overall transfer level: Needs assistance Equipment used: Rolling walker (2 wheels) Transfers: Sit to/from Stand Sit to Stand: Contact guard assist           General transfer comment: cues for hand and  right leg position,    Ambulation/Gait Ambulation/Gait assistance: Contact guard assist Gait Distance (Feet): 50 Feet Assistive device: Rolling walker (2 wheels) Gait Pattern/deviations: Step-to pattern, Antalgic Gait velocity: decreased     General Gait Details: cues for sequence. patient moves very slowly.   Stairs Stairs:  Yes Stairs assistance: Min assist Stair Management: Sideways, One rail Right Number of Stairs: 2 General stair comments: spouse present to assist, cues to turn sideways a s pt. does not have a cane, patient performed well with no signs of right knee buckling   Wheelchair Mobility     Tilt Bed    Modified Rankin (Stroke Patients Only)       Balance Overall balance assessment: Mild deficits observed, not formally tested Sitting-balance support: Feet supported Sitting balance-Leahy Scale: Good     Standing balance support: Bilateral upper extremity supported, During functional activity, Reliant on assistive device for balance Standing balance-Leahy Scale: Fair                              Hotel Manager: No apparent difficulties  Cognition Arousal: Alert Behavior During Therapy: Flat affect, WFL for tasks assessed/performed   PT - Cognitive impairments: No apparent impairments                         Following commands: Intact      Cueing    Exercises Total Joint Exercises Ankle Circles/Pumps: AROM Quad Sets: AROM, Both Heel Slides: AAROM, Right, 10 reps Hip ABduction/ADduction: AAROM, Right, 5 reps Long Arc Quad: AAROM, Right, 10 reps Knee Flexion: AAROM, Right, 10 reps Goniometric ROM: 10-50 right knee flex    General Comments        Pertinent Vitals/Pain Pain Assessment Pain Score: 4  Pain Location: R knee and LE, Pain Descriptors / Indicators: Aching, Constant, Discomfort,  Dull, Grimacing, Operative site guarding Pain Intervention(s): Premedicated before session, Monitored during session    Home Living                          Prior Function            PT Goals (current goals can now be found in the care plan section) Progress towards PT goals: Progressing toward goals    Frequency    7X/week      PT Plan      Co-evaluation              AM-PAC PT 6 Clicks Mobility    Outcome Measure  Help needed turning from your back to your side while in a flat bed without using bedrails?: None Help needed moving from lying on your back to sitting on the side of a flat bed without using bedrails?: A Little Help needed moving to and from a bed to a chair (including a wheelchair)?: A Little Help needed standing up from a chair using your arms (e.g., wheelchair or bedside chair)?: A Little Help needed to walk in hospital room?: A Little Help needed climbing 3-5 steps with a railing? : A Little 6 Click Score: 19    End of Session Equipment Utilized During Treatment: Gait belt Activity Tolerance: Patient tolerated treatment well Patient left: with call bell/phone within reach;with family/visitor present;in chair Nurse Communication: Mobility status;Patient requests pain meds PT Visit Diagnosis: Unsteadiness on feet (R26.81);Other abnormalities of gait and mobility (R26.89);Muscle weakness (generalized) (M62.81);Difficulty in walking, not elsewhere classified (R26.2);Pain Pain - Right/Left: Right Pain - part of body: Knee;Leg     Time: 1051-1106 PT Time Calculation (min) (ACUTE ONLY): 15 min  Charges:    $Gait Training: 8-22 mins $Self Care/Home Management: 8-22 PT General Charges $$ ACUTE PT VISIT: 1 Visit                     Katherine Acosta PT Acute Rehabilitation Services Office [phone]    Katherine Acosta 04/17/2024, 2:02 PM

## 2024-04-17 NOTE — Discharge Summary (Signed)
 Physician Discharge Summary  Patient ID: Katherine Acosta MRN: [id number] DOB/AGE: 1960/06/15 63 y.o.  Admit date: 04/16/2024 Discharge date: 04/17/2024  Admission Diagnoses:  Localized osteoarthritis of right knee  Discharge Diagnoses:  Principal Problem:   Localized osteoarthritis of right knee   Past Medical History:  Diagnosis Date   Anemia    low iron   Anxiety    Arthritis    Cancer (HCC)    left breast cancer - march, 2015   Depression    Diabetes mellitus without complication (HCC)    type 2   Diarrhea    DKA (diabetic ketoacidosis) (HCC) 2024   Headache    migraines   Heart murmur    History of hiatal hernia    History of kidney stones    Hypertension    Personal history of chemotherapy    PONV (postoperative nausea and vomiting)    also has difficulty waking up    Surgeries: Procedures: ARTHROPLASTY, KNEE, TOTAL on 04/16/2024   Consultants (if any):   Discharged Condition: Improved  Hospital Course: Katherine Acosta is an 63 y.o. female who was admitted 04/16/2024 with a diagnosis of Localized osteoarthritis of right knee and went to the operating room on 04/16/2024 and underwent the above named procedures.    She was given perioperative antibiotics:  Anti-infectives (From admission, onward)    Start     Dose/Rate Route Frequency Ordered Stop   04/16/24 1730  ceFAZolin  (ANCEF ) IVPB 2g/100 mL premix        2 g 200 mL/hr over 30 Minutes Intravenous Every 6 hours 04/16/24 1517 04/17/24 0044   04/16/24 1000  ceFAZolin  (ANCEF ) IVPB 2g/100 mL premix        2 g 200 mL/hr over 30 Minutes Intravenous On call to O.R. 04/16/24 0840 04/16/24 1128     .  She was given sequential compression devices, early ambulation, and aspirin  for DVT prophylaxis.  She benefited maximally from the hospital stay and there were no complications.    Recent vital signs:  Vitals:   04/17/24 0215 04/17/24 0537  BP: 134/73 135/83  Pulse: 78 83  Resp: 16 15  Temp: 98.1 F (36.7  C) (!) 97.5 F (36.4 C)  SpO2: 99% 99%    Recent laboratory studies:  Lab Results  Component Value Date   HGB 12.9 04/17/2024   HGB 14.7 04/08/2024   HGB 11.8 06/14/2017   Lab Results  Component Value Date   WBC 6.5 04/17/2024   PLT 125 (L) 04/17/2024   No results found for: INR Lab Results  Component Value Date   NA 137 04/17/2024   K 3.6 04/17/2024   CL 101 04/17/2024   CO2 26 04/17/2024   BUN 5 (L) 04/17/2024   CREATININE 0.51 04/17/2024   GLUCOSE 186 (H) 04/17/2024    Discharge Medications:   Allergies as of 04/17/2024       Reactions   Tape Itching, Other (See Comments)   Adhesive (plastic tape) causes itching and skin to peel        Medication List     STOP taking these medications    cyclobenzaprine  10 MG tablet Commonly known as: FLEXERIL        TAKE these medications    acetaminophen  500 MG tablet Commonly known as: TYLENOL  Take 2 tablets (1,000 mg total) by mouth every 8 (eight) hours as needed.   aspirin  EC 81 MG tablet Take 1 tablet (81 mg total) by mouth 2 (two) times daily for 28 days.  Swallow whole. What changed:  when to take this additional instructions   B-12 5000 MCG Caps Take 5,000 mcg by mouth daily.   benazepril  10 MG tablet Commonly known as: LOTENSIN  Take 10 mg by mouth daily.   CALCIUM 1200 PO Take 1 tablet by mouth daily.   celecoxib  100 MG capsule Commonly known as: CeleBREX  Take 1 capsule (100 mg total) by mouth 2 (two) times daily for 14 days.   Coenzyme Q10 100 MG Tabs Take 100 mg by mouth daily.   fluticasone  50 MCG/ACT nasal spray Commonly known as: FLONASE  Place 2 sprays into both nostrils daily as needed for allergies or rhinitis.   glipiZIDE  2.5 MG 24 hr tablet Commonly known as: GLUCOTROL  XL Take 7.5 mg by mouth in the morning and at bedtime.   Jardiance  25 MG Tabs tablet Generic drug: empagliflozin  Take 25 mg by mouth daily.   Lantus  SoloStar 100 UNIT/ML Solostar Pen Generic drug:  insulin  glargine Inject 15 Units into the skin at bedtime.   methocarbamol  500 MG tablet Commonly known as: ROBAXIN  Take 1 tablet (500 mg total) by mouth every 8 (eight) hours as needed for up to 10 days for muscle spasms.   metoCLOPramide  5 MG tablet Commonly known as: REGLAN  Take 5 mg by mouth in the morning, at noon, and at bedtime.   multivitamin capsule Take 1 capsule by mouth daily.   nystatin cream Commonly known as: MYCOSTATIN Apply 1 Application topically daily as needed (rash).   ondansetron  4 MG tablet Commonly known as: Zofran  Take 1 tablet (4 mg total) by mouth every 8 (eight) hours as needed for up to 14 days for nausea or vomiting.   oxyCODONE  5 MG immediate release tablet Commonly known as: Roxicodone  Take 1 tablet (5 mg total) by mouth every 4 (four) hours as needed for up to 7 days for severe pain (pain score 7-10) or moderate pain (pain score 4-6).   pantoprazole  40 MG tablet Commonly known as: PROTONIX  Take 40 mg by mouth daily.   polyethylene glycol 17 g packet Commonly known as: MiraLax  Take 17 g by mouth daily.   pravastatin  20 MG tablet Commonly known as: PRAVACHOL  Take 20 mg by mouth daily.   sucralfate  1 g tablet Commonly known as: CARAFATE  Take 1 g by mouth in the morning, at noon, and at bedtime.   traZODone  50 MG tablet Commonly known as: DESYREL  Take 50 mg by mouth at bedtime as needed for sleep.   venlafaxine  XR 75 MG 24 hr capsule Commonly known as: EFFEXOR -XR Take 75 mg by mouth daily with breakfast.   Vitamin D3 50 MCG (2000 UT) Tabs Take 2,000 Units by mouth daily.        Diagnostic Studies: DG Knee Right Port Result Date: 04/16/2024 CLINICAL DATA:  Postoperative state. EXAM: PORTABLE RIGHT KNEE - 1-2 VIEW COMPARISON:  None Available. FINDINGS: There is a total right knee arthroplasty. The arthroplasty components appear intact and in anatomic alignment. There is no acute fracture or dislocation. There is air and fluid in  the joint space. The soft tissues are grossly unremarkable IMPRESSION: Status post total right knee arthroplasty. No immediate complication. Electronically Signed   By: Vanetta Chou M.D.   On: 04/16/2024 18:02    Disposition: Discharge disposition: 01-Home or Self Care       Discharge Instructions     Call MD / Call 911   Complete by: As directed    If you experience chest pain or shortness of breath,  CALL 911 and be transported to the hospital emergency room.  If you develope a fever above 101 F, pus (white drainage) or increased drainage or redness at the wound, or calf pain, call your surgeon's office.   Call MD / Call 911   Complete by: As directed    If you experience chest pain or shortness of breath, CALL 911 and be transported to the hospital emergency room.  If you develope a fever above 101 F, pus (white drainage) or increased drainage or redness at the wound, or calf pain, call your surgeon's office.   Constipation Prevention   Complete by: As directed    Drink plenty of fluids.  Prune juice may be helpful.  You may use a stool softener, such as Colace (over the counter) 100 mg twice a day.  Use MiraLax  (over the counter) for constipation as needed.   Constipation Prevention   Complete by: As directed    Drink plenty of fluids.  Prune juice may be helpful.  You may use a stool softener, such as Colace (over the counter) 100 mg twice a day.  Use MiraLax  (over the counter) for constipation as needed.   Diet - low sodium heart healthy   Complete by: As directed    Do not put a pillow under the knee. Place it under the heel.   Complete by: As directed    Driving restrictions   Complete by: As directed    No driving for 4-6 weeks   Increase activity slowly as tolerated   Complete by: As directed    Increase activity slowly as tolerated   Complete by: As directed    Post-operative opioid taper instructions:   Complete by: As directed    POST-OPERATIVE OPIOID TAPER  INSTRUCTIONS: It is important to wean off of your opioid medication as soon as possible. If you do not need pain medication after your surgery it is ok to stop day one. Opioids include: Codeine, Hydrocodone(Norco, Vicodin), Oxycodone (Percocet, oxycontin ) and hydromorphone  amongst others.  Long term and even short term use of opiods can cause: Increased pain response Dependence Constipation Depression Respiratory depression And more.  Withdrawal symptoms can include Flu like symptoms Nausea, vomiting And more Techniques to manage these symptoms Hydrate well Eat regular healthy meals Stay active Use relaxation techniques(deep breathing, meditating, yoga) Do Not substitute Alcohol  to help with tapering If you have been on opioids for less than two weeks and do not have pain than it is ok to stop all together.  Plan to wean off of opioids This plan should start within one week post op of your joint replacement. Maintain the same interval or time between taking each dose and first decrease the dose.  Cut the total daily intake of opioids by one tablet each day Next start to increase the time between doses. The last dose that should be eliminated is the evening dose.      Post-operative opioid taper instructions:   Complete by: As directed    POST-OPERATIVE OPIOID TAPER INSTRUCTIONS: It is important to wean off of your opioid medication as soon as possible. If you do not need pain medication after your surgery it is ok to stop day one. Opioids include: Codeine, Hydrocodone(Norco, Vicodin), Oxycodone (Percocet, oxycontin ) and hydromorphone  amongst others.  Long term and even short term use of opiods can cause: Increased pain response Dependence Constipation Depression Respiratory depression And more.  Withdrawal symptoms can include Flu like symptoms Nausea, vomiting And more Techniques to manage these  symptoms Hydrate well Eat regular healthy meals Stay active Use  relaxation techniques(deep breathing, meditating, yoga) Do Not substitute Alcohol  to help with tapering If you have been on opioids for less than two weeks and do not have pain than it is ok to stop all together.  Plan to wean off of opioids This plan should start within one week post op of your joint replacement. Maintain the same interval or time between taking each dose and first decrease the dose.  Cut the total daily intake of opioids by one tablet each day Next start to increase the time between doses. The last dose that should be eliminated is the evening dose.      TED hose   Complete by: As directed    Use stockings (TED hose) for 2 weeks on both leg(s).  Then for 2 more weeks on the surgical leg.  You may remove them at night for sleeping.        Follow-up Information     Edna Toribio LABOR, MD Follow up in 2 week(s).   Specialty: Orthopedic Surgery Contact information: [address] [place] [address] [phone]                    Discharge Instructions      INSTRUCTIONS AFTER JOINT REPLACEMENT   Remove items at home which could result in a fall. This includes throw rugs or furniture in walking pathways ICE to the affected joint every three hours while awake for 30 minutes at a time, for at least the first 3-5 days, and then as needed for pain and swelling.  Continue to use ice for pain and swelling. You may notice swelling that will progress down to the foot and ankle.  This is normal after surgery.  Elevate your leg when you are not up walking on it.   Continue to use the breathing machine you got in the hospital (incentive spirometer) which will help keep your temperature down.  It is common for your temperature to cycle up and down following surgery, especially at night when you are not up moving around and exerting yourself.  The breathing machine keeps your lungs expanded and your temperature down.  DIET:  As you were doing prior to  hospitalization, we recommend a well-balanced diet.  DRESSING / WOUND CARE / SHOWERING:  Keep the surgical dressing until follow up.  The dressing is water  proof, so you can shower without any extra covering.  IF THE DRESSING FALLS OFF or the wound gets wet inside, change the dressing with sterile gauze.  Please use good hand washing techniques before changing the dressing.  Do not use any lotions or creams on the incision until instructed by your surgeon.    ACTIVITY  Increase activity slowly as tolerated, but follow the weight bearing instructions below.   No driving for 6 weeks or until further direction given by your physician.  You cannot drive while taking narcotics.  No lifting or carrying greater than 10 lbs. until further directed by your surgeon. Avoid periods of inactivity such as sitting longer than an hour when not asleep. This helps prevent blood clots.  You may return to work once you are authorized by your doctor.   WEIGHT BEARING: Weight bearing as tolerated with assist device (walker, cane, etc) as directed, use it as long as suggested by your surgeon or therapist, typically at least 4-6 weeks.  EXERCISES  Results after joint replacement surgery are often  greatly improved when you follow the exercise, range of motion and muscle strengthening exercises prescribed by your doctor. Safety measures are also important to protect the joint from further injury. Any time any of these exercises cause you to have increased pain or swelling, decrease what you are doing until you are comfortable again and then slowly increase them. If you have problems or questions, call your caregiver or physical therapist for advice.   Rehabilitation is important following a joint replacement. After just a few days of immobilization, the muscles of the leg can become weakened and shrink (atrophy).  These exercises are designed to build up the tone and strength of the thigh and leg muscles and to improve  motion. Often times heat used for twenty to thirty minutes before working out will loosen up your tissues and help with improving the range of motion but do not use heat for the first two weeks following surgery (sometimes heat can increase post-operative swelling).   These exercises can be done on a training (exercise) mat, on the floor, on a table or on a bed. Use whatever works the best and is most comfortable for you.    Use music or television while you are exercising so that the exercises are a pleasant break in your day. This will make your life better with the exercises acting as a break in your routine that you can look forward to.   Perform all exercises about fifteen times, three times per day or as directed.  You should exercise both the operative leg and the other leg as well.  Exercises include:   Quad Sets - Tighten up the muscle on the front of the thigh (Quad) and hold for 5-10 seconds.   Straight Leg Raises - With your knee straight (if you were given a brace, keep it on), lift the leg to 60 degrees, hold for 3 seconds, and slowly lower the leg.  Perform this exercise against resistance later as your leg gets stronger.  Leg Slides: Lying on your back, slowly slide your foot toward your buttocks, bending your knee up off the floor (only go as far as is comfortable). Then slowly slide your foot back down until your leg is flat on the floor again.  Angel Wings: Lying on your back spread your legs to the side as far apart as you can without causing discomfort.  Hamstring Strength:  Lying on your back, push your heel against the floor with your leg straight by tightening up the muscles of your buttocks.  Repeat, but this time bend your knee to a comfortable angle, and push your heel against the floor.  You may put a pillow under the heel to make it more comfortable if necessary.   A rehabilitation program following joint replacement surgery can speed recovery and prevent re-injury in the  future due to weakened muscles. Contact your doctor or a physical therapist for more information on knee rehabilitation.   CONSTIPATION:  Constipation is defined medically as fewer than three stools per week and severe constipation as less than one stool per week.  Even if you have a regular bowel pattern at home, your normal regimen is likely to be disrupted due to multiple reasons following surgery.  Combination of anesthesia, postoperative narcotics, change in appetite and fluid intake all can affect your bowels.   YOU MUST use at least one of the following options; they are listed in order of increasing strength to get the job done.  They are  all available over the counter, and you may need to use some, POSSIBLY even all of these options:    Drink plenty of fluids (prune juice may be helpful) and high fiber foods Colace 100 mg by mouth twice a day  Senokot for constipation as directed and as needed Dulcolax (bisacodyl), take with full glass of water   Miralax  (polyethylene glycol) once or twice a day as needed.  If you have tried all these things and are unable to have a bowel movement in the first 3-4 days after surgery call either your surgeon or your primary doctor.    If you experience loose stools or diarrhea, hold the medications until you stool forms back up.  If your symptoms do not get better within 1 week or if they get worse, check with your doctor.  If you experience the worst abdominal pain ever or develop nausea or vomiting, please contact the office immediately for further recommendations for treatment.  ITCHING:  If you experience itching with your medications, try taking only a single pain pill, or even half a pain pill at a time.  You can also use Benadryl  over the counter for itching or also to help with sleep.   TED HOSE STOCKINGS:  Use stockings on both legs until for at least 2 weeks or as directed by physician office. They may be removed at night for  sleeping.  MEDICATIONS:  See your medication summary on the After Visit Summary that nursing will review with you.  You may have some home medications which will be placed on hold until you complete the course of blood thinner medication.  It is important for you to complete the blood thinner medication as prescribed.  Blood clot prevention (DVT Prophylaxis): After surgery you are at an increased risk for a blood clot.  You were prescribed a blood thinner, Aspirin  81mg , to be taken twice daily for a total of 4 weeks from surgery to help reduce your risk of getting a blood clot.  Signs of a pulmonary embolus (blood clot in the lungs) include sudden short of breath, feeling lightheaded or dizzy, chest pain with a deep breath, rapid pulse rapid breathing.  Signs of a blood clot in your arms or legs include new unexplained swelling and cramping, warm, red or darkened skin around the painful area.  Please call the office or 911 right away if these signs or symptoms develop.  PRECAUTIONS:   If you experience chest pain or shortness of breath - call 911 immediately for transfer to the hospital emergency department.   If you develop a fever greater that 101 F, purulent drainage from wound, increased redness or drainage from wound, foul odor from the wound/dressing, or calf pain - CONTACT YOUR SURGEON.                                                   FOLLOW-UP APPOINTMENTS:  If you do not already have a post-op appointment, please call the office for an appointment to be seen by your surgeon.  Guidelines for how soon to be seen are listed in your After Visit Summary, but are typically between 2-3 weeks after surgery.  If you have a specialized bandage, you may be told to follow up 1 week after surgery.  OTHER INSTRUCTIONS:  Knee Replacement:  Do not place pillow under knee,  focus on keeping the knee straight while resting.  Place foam block, curve side up under heel at all times except when walking.  DO NOT  modify, tear, cut, or change the foam block in any way.  POST-OPERATIVE OPIOID TAPER INSTRUCTIONS: It is important to wean off of your opioid medication as soon as possible. If you do not need pain medication after your surgery it is ok to stop day one. Opioids include: Codeine, Hydrocodone(Norco, Vicodin), Oxycodone (Percocet, oxycontin ) and hydromorphone  amongst others.  Long term and even short term use of opiods can cause: Increased pain response Dependence Constipation Depression Respiratory depression And more.  Withdrawal symptoms can include Flu like symptoms Nausea, vomiting And more Techniques to manage these symptoms Hydrate well Eat regular healthy meals Stay active Use relaxation techniques(deep breathing, meditating, yoga) Do Not substitute Alcohol  to help with tapering If you have been on opioids for less than two weeks and do not have pain than it is ok to stop all together.  Plan to wean off of opioids This plan should start within one week post op of your joint replacement. Maintain the same interval or time between taking each dose and first decrease the dose.  Cut the total daily intake of opioids by one tablet each day Next start to increase the time between doses. The last dose that should be eliminated is the evening dose.   MAKE SURE YOU:  Understand these instructions.  Get help right away if you are not doing well or get worse.    Thank you for letting us  be a part of your medical care team.  It is a privilege we respect greatly.  We hope these instructions will help you stay on track for a fast and full recovery!            Signed: Elmon Shader A Nastacia Raybuck 04/17/2024, 8:02 AM
